# Patient Record
Sex: Female | Born: 1950 | Race: White | Hispanic: No | Marital: Married | Smoking: Never smoker
Health system: Southern US, Community
[De-identification: ages and names within clinical notes are randomized; demographics above are authoritative.]

## PROBLEM LIST (undated history)

## (undated) DIAGNOSIS — E7800 Pure hypercholesterolemia, unspecified: Secondary | ICD-10-CM

## (undated) DIAGNOSIS — E119 Type 2 diabetes mellitus without complications: Secondary | ICD-10-CM

## (undated) DIAGNOSIS — E669 Obesity, unspecified: Secondary | ICD-10-CM

## (undated) DIAGNOSIS — Z9889 Other specified postprocedural states: Secondary | ICD-10-CM

## (undated) DIAGNOSIS — Z860100 Personal history of colon polyps, unspecified: Secondary | ICD-10-CM

## (undated) DIAGNOSIS — R002 Palpitations: Secondary | ICD-10-CM

## (undated) DIAGNOSIS — Z9989 Dependence on other enabling machines and devices: Secondary | ICD-10-CM

## (undated) DIAGNOSIS — I371 Nonrheumatic pulmonary valve insufficiency: Secondary | ICD-10-CM

## (undated) DIAGNOSIS — Z803 Family history of malignant neoplasm of breast: Secondary | ICD-10-CM

## (undated) DIAGNOSIS — I4892 Unspecified atrial flutter: Secondary | ICD-10-CM

## (undated) DIAGNOSIS — G709 Myoneural disorder, unspecified: Secondary | ICD-10-CM

## (undated) DIAGNOSIS — I6529 Occlusion and stenosis of unspecified carotid artery: Secondary | ICD-10-CM

## (undated) DIAGNOSIS — G459 Transient cerebral ischemic attack, unspecified: Secondary | ICD-10-CM

## (undated) DIAGNOSIS — M1990 Unspecified osteoarthritis, unspecified site: Secondary | ICD-10-CM

## (undated) DIAGNOSIS — M810 Age-related osteoporosis without current pathological fracture: Secondary | ICD-10-CM

## (undated) DIAGNOSIS — I10 Essential (primary) hypertension: Secondary | ICD-10-CM

## (undated) DIAGNOSIS — H269 Unspecified cataract: Secondary | ICD-10-CM

## (undated) DIAGNOSIS — R519 Headache, unspecified: Secondary | ICD-10-CM

## (undated) DIAGNOSIS — Z1239 Encounter for other screening for malignant neoplasm of breast: Secondary | ICD-10-CM

## (undated) DIAGNOSIS — Z1211 Encounter for screening for malignant neoplasm of colon: Secondary | ICD-10-CM

## (undated) DIAGNOSIS — T4145XA Adverse effect of unspecified anesthetic, initial encounter: Secondary | ICD-10-CM

## (undated) DIAGNOSIS — N6019 Diffuse cystic mastopathy of unspecified breast: Secondary | ICD-10-CM

## (undated) DIAGNOSIS — I499 Cardiac arrhythmia, unspecified: Secondary | ICD-10-CM

## (undated) DIAGNOSIS — K219 Gastro-esophageal reflux disease without esophagitis: Secondary | ICD-10-CM

## (undated) DIAGNOSIS — R609 Edema, unspecified: Secondary | ICD-10-CM

## (undated) DIAGNOSIS — E538 Deficiency of other specified B group vitamins: Secondary | ICD-10-CM

## (undated) DIAGNOSIS — T8859XA Other complications of anesthesia, initial encounter: Secondary | ICD-10-CM

## (undated) DIAGNOSIS — Z8489 Family history of other specified conditions: Secondary | ICD-10-CM

## (undated) DIAGNOSIS — I639 Cerebral infarction, unspecified: Secondary | ICD-10-CM

## (undated) DIAGNOSIS — R112 Nausea with vomiting, unspecified: Secondary | ICD-10-CM

## (undated) DIAGNOSIS — G4730 Sleep apnea, unspecified: Secondary | ICD-10-CM

## (undated) HISTORY — DX: Diffuse cystic mastopathy of unspecified breast: N60.19

## (undated) HISTORY — DX: Unspecified osteoarthritis, unspecified site: M19.90

## (undated) HISTORY — DX: Essential (primary) hypertension: I10

## (undated) HISTORY — PX: COLONOSCOPY: SHX174

## (undated) HISTORY — DX: Type 2 diabetes mellitus without complications: E11.9

## (undated) HISTORY — DX: Unspecified atrial flutter: I48.92

## (undated) HISTORY — PX: EYE SURGERY: SHX253

## (undated) HISTORY — PX: DILATION AND CURETTAGE OF UTERUS: SHX78

## (undated) HISTORY — DX: Personal history of colonic polyps: Z86.010

## (undated) HISTORY — DX: Personal history of colon polyps, unspecified: Z86.0100

## (undated) HISTORY — DX: Pure hypercholesterolemia, unspecified: E78.00

## (undated) HISTORY — DX: Obesity, unspecified: E66.9

## (undated) HISTORY — DX: Family history of malignant neoplasm of breast: Z80.3

## (undated) HISTORY — DX: Encounter for other screening for malignant neoplasm of breast: Z12.39

## (undated) HISTORY — PX: OOPHORECTOMY: SHX86

## (undated) HISTORY — DX: Encounter for screening for malignant neoplasm of colon: Z12.11

---

## 1976-03-26 HISTORY — PX: LAPAROSCOPY: SHX197

## 1976-03-26 HISTORY — PX: PILONIDAL CYST / SINUS EXCISION: SUR543

## 1981-03-26 HISTORY — PX: TUBAL LIGATION: SHX77

## 1989-03-26 HISTORY — PX: BREAST EXCISIONAL BIOPSY: SUR124

## 1993-03-26 HISTORY — PX: BREAST EXCISIONAL BIOPSY: SUR124

## 1996-03-26 HISTORY — PX: ABDOMINAL HYSTERECTOMY: SHX81

## 2004-02-03 ENCOUNTER — Ambulatory Visit: Payer: Self-pay | Admitting: General Surgery

## 2004-02-14 ENCOUNTER — Ambulatory Visit: Payer: Self-pay | Admitting: General Surgery

## 2005-02-08 ENCOUNTER — Ambulatory Visit: Payer: Self-pay | Admitting: General Surgery

## 2005-02-16 ENCOUNTER — Ambulatory Visit: Payer: Self-pay | Admitting: Family Medicine

## 2006-02-26 ENCOUNTER — Ambulatory Visit: Payer: Self-pay | Admitting: General Surgery

## 2007-04-08 ENCOUNTER — Ambulatory Visit: Payer: Self-pay | Admitting: General Surgery

## 2007-09-11 ENCOUNTER — Ambulatory Visit: Payer: Self-pay | Admitting: Unknown Physician Specialty

## 2008-03-26 HISTORY — PX: COLONOSCOPY: SHX174

## 2008-04-08 ENCOUNTER — Ambulatory Visit: Payer: Self-pay | Admitting: Unknown Physician Specialty

## 2009-03-19 ENCOUNTER — Emergency Department: Payer: Self-pay | Admitting: Unknown Physician Specialty

## 2009-04-08 ENCOUNTER — Emergency Department: Payer: Self-pay | Admitting: Emergency Medicine

## 2009-04-21 ENCOUNTER — Ambulatory Visit: Payer: Self-pay | Admitting: Internal Medicine

## 2009-05-09 ENCOUNTER — Ambulatory Visit: Payer: Self-pay | Admitting: General Surgery

## 2009-05-17 ENCOUNTER — Ambulatory Visit: Payer: Self-pay | Admitting: Internal Medicine

## 2009-12-14 ENCOUNTER — Ambulatory Visit: Payer: Self-pay | Admitting: Family Medicine

## 2010-05-10 ENCOUNTER — Ambulatory Visit: Payer: Self-pay | Admitting: General Surgery

## 2011-03-14 ENCOUNTER — Ambulatory Visit: Payer: Self-pay | Admitting: Obstetrics and Gynecology

## 2011-05-14 ENCOUNTER — Ambulatory Visit: Payer: Self-pay | Admitting: General Surgery

## 2011-07-03 ENCOUNTER — Ambulatory Visit: Payer: Self-pay | Admitting: Otolaryngology

## 2011-07-03 LAB — CREATININE, SERUM
Creatinine: 0.83 mg/dL (ref 0.60–1.30)
EGFR (African American): >60
EGFR (Non-African Amer.): >60

## 2012-05-03 ENCOUNTER — Encounter: Payer: Self-pay | Admitting: General Surgery

## 2012-06-11 ENCOUNTER — Ambulatory Visit: Payer: Self-pay | Admitting: General Surgery

## 2012-06-18 ENCOUNTER — Ambulatory Visit: Payer: Self-pay | Admitting: General Surgery

## 2012-06-19 ENCOUNTER — Encounter: Payer: Self-pay | Admitting: General Surgery

## 2012-07-01 ENCOUNTER — Ambulatory Visit (INDEPENDENT_AMBULATORY_CARE_PROVIDER_SITE_OTHER): Payer: BC Managed Care – PPO | Admitting: General Surgery

## 2012-07-01 ENCOUNTER — Encounter: Payer: Self-pay | Admitting: General Surgery

## 2012-07-01 VITALS — BP 102/62 | HR 70 | Resp 12 | Ht 63.0 in | Wt 157.0 lb

## 2012-07-01 DIAGNOSIS — Z1231 Encounter for screening mammogram for malignant neoplasm of breast: Secondary | ICD-10-CM

## 2012-07-01 DIAGNOSIS — N6019 Diffuse cystic mastopathy of unspecified breast: Secondary | ICD-10-CM | POA: Insufficient documentation

## 2012-07-01 NOTE — Patient Instructions (Addendum)
Continue self breast exams. Call office for any new breast issues or concerns. Patient will be asked to return to the office in one year with a bilateral screening mammogram. 

## 2012-07-01 NOTE — Progress Notes (Signed)
Patient ID: Pamela Nash, female   DOB: Nov 29, 1950, 62 y.o.   MRN: [id number]  Chief Complaint  Patient presents with  . Other    mammogram    HPI Pamela Nash is a 62 y.o. female here today for her follow up mammogram done on 05/26/12.  Patient with known history of fibrocystic breast disease.  Family history includes a maternal grandmother with breast cancer. No new breast issues. Pamela Nash HPI  Past Medical History  Diagnosis Date  . Unspecified essential hypertension   . Diffuse cystic mastopathy   . Arthritis   . Breast screening, unspecified   . Special screening for malignant neoplasms, colon   . Diabetes     non-insulin dependent  . Obesity, unspecified   . High cholesterol   . Personal history of colonic polyps   . Atrial flutter     c-pap dependent  . Family history of malignant neoplasm of breast     Past Surgical History  Procedure Laterality Date  . Pilonidal cyst / sinus excision  1978  . Abdominal hysterectomy  1998  . Breast biopsy  1993  . Tubal ligation  1983  . Laparoscopy  1978  . Colonoscopy  2010    Family History  Problem Relation Age of Onset  . Breast cancer Other     Social History History  Substance Use Topics  . Smoking status: Never Smoker   . Smokeless tobacco: Not on file  . Alcohol Use: No    Allergies  Allergen Reactions  . Sulfa Antibiotics Hives    Current Outpatient Prescriptions  Medication Sig Dispense Refill  . aspirin 81 MG tablet Take 81 mg by mouth daily.      . Cholecalciferol (VITAMIN D-3) 1000 UNITS CAPS Take by mouth 2 (two) times daily.      Pamela Nash losartan-hydrochlorothiazide (HYZAAR) 50-12.5 MG per tablet       . metFORMIN (GLUCOPHAGE) 500 MG tablet Take 500 mg by mouth 2 (two) times daily with a meal.      . metoprolol tartrate (LOPRESSOR) 25 MG tablet Take 25 mg by mouth daily.       Letta Pate DELICA LANCETS 33G MISC       . potassium chloride (K-DUR,KLOR-CON) 10 MEQ tablet Take 10 mEq by mouth 2 (two) times  daily.      . rosuvastatin (CRESTOR) 10 MG tablet Take 10 mg by mouth daily.      Pamela Nash VIVELLE-DOT 0.1 MG/24HR Place 1 patch onto the skin 2 (two) times a week.       . ONE TOUCH ULTRA TEST test strip        No current facility-administered medications for this visit.    Review of Systems Review of Systems  Constitutional: Negative.   Respiratory: Negative.   Cardiovascular: Negative.     Blood pressure 102/62, pulse 70, resp. rate 12, height 5\' 3"  (1.6 m), weight 157 lb (71.215 kg).  Physical Exam Physical Exam  Constitutional: She appears well-developed and well-nourished.  Neck: No mass and no thyromegaly present.  Cardiovascular: Normal rate and regular rhythm.   Pulmonary/Chest: Effort normal and breath sounds normal. Right breast exhibits no inverted nipple, no mass, no nipple discharge, no skin change and no tenderness. Left breast exhibits no inverted nipple, no mass, no nipple discharge, no skin change and no tenderness.  Abdominal: Soft.  Lymphadenopathy:    She has no cervical adenopathy.    She has no axillary adenopathy.    Data Reviewed Mammogram reviewed-BIRADS  2  Assessment    Stable exam.     Plan    1 yr f/u        SANKAR,SEEPLAPUTHUR G 07/01/2012, 8:16 PM

## 2013-01-23 ENCOUNTER — Ambulatory Visit: Payer: Self-pay | Admitting: Unknown Physician Specialty

## 2013-06-23 ENCOUNTER — Ambulatory Visit: Payer: Self-pay | Admitting: General Surgery

## 2013-06-26 ENCOUNTER — Ambulatory Visit: Payer: Self-pay | Admitting: General Surgery

## 2013-06-29 ENCOUNTER — Encounter: Payer: Self-pay | Admitting: General Surgery

## 2013-06-30 ENCOUNTER — Ambulatory Visit (INDEPENDENT_AMBULATORY_CARE_PROVIDER_SITE_OTHER): Payer: BC Managed Care – PPO | Admitting: General Surgery

## 2013-06-30 ENCOUNTER — Encounter: Payer: Self-pay | Admitting: General Surgery

## 2013-06-30 VITALS — BP 148/76 | HR 80 | Resp 12 | Ht 63.75 in | Wt 159.0 lb

## 2013-06-30 DIAGNOSIS — N6019 Diffuse cystic mastopathy of unspecified breast: Secondary | ICD-10-CM

## 2013-06-30 DIAGNOSIS — Z803 Family history of malignant neoplasm of breast: Secondary | ICD-10-CM

## 2013-06-30 NOTE — Patient Instructions (Signed)
Continue self breast exams. Call office for any new breast issues or concerns. 

## 2013-06-30 NOTE — Progress Notes (Signed)
Patient ID: Pamela Nash, female   DOB: 22-Jun-1950, 63 y.o.   MRN: [id number]  Chief Complaint  Patient presents with  . Follow-up    mammogram    HPI Pamela Nash is a 63 y.o. female.  who presents for her annual breast evaluation. The most recent mammogram was done on 06-23-13. An ultrasound was done as well. Patient does perform regular self breast checks and gets regular mammograms done.  No new breast complaints.   HPI  Past Medical History  Diagnosis Date  . Unspecified essential hypertension   . Diffuse cystic mastopathy   . Arthritis   . Breast screening, unspecified   . Special screening for malignant neoplasms, colon   . Diabetes     non-insulin dependent  . Obesity, unspecified   . High cholesterol   . Personal history of colonic polyps   . Atrial flutter     c-pap dependent  . Family history of malignant neoplasm of breast     Past Surgical History  Procedure Laterality Date  . Pilonidal cyst / sinus excision  1978  . Abdominal hysterectomy  1998  . Breast biopsy  1993  . Tubal ligation  1983  . Laparoscopy  1978  . Colonoscopy  2010    Family History  Problem Relation Age of Onset  . Breast cancer Maternal Grandmother     Social History History  Substance Use Topics  . Smoking status: Never Smoker   . Smokeless tobacco: Not on file  . Alcohol Use: No    Allergies  Allergen Reactions  . Sulfa Antibiotics Hives    Current Outpatient Prescriptions  Medication Sig Dispense Refill  . aspirin 81 MG tablet Take 81 mg by mouth daily.      . Cholecalciferol (VITAMIN D-3) 1000 UNITS CAPS Take by mouth 2 (two) times daily.      Marland Kitchen losartan-hydrochlorothiazide (HYZAAR) 50-12.5 MG per tablet       . metFORMIN (GLUCOPHAGE) 500 MG tablet Take 500 mg by mouth 2 (two) times daily with a meal.      . metoprolol tartrate (LOPRESSOR) 25 MG tablet Take 25 mg by mouth daily.       . ONE TOUCH ULTRA TEST test strip       . ONETOUCH DELICA LANCETS 09F MISC        . rosuvastatin (CRESTOR) 10 MG tablet Take 10 mg by mouth daily.      Marland Kitchen VIVELLE-DOT 0.1 MG/24HR Place 1 patch onto the skin 2 (two) times a week.        No current facility-administered medications for this visit.    Review of Systems Review of Systems  Constitutional: Negative.   Respiratory: Negative.   Cardiovascular: Negative.     Blood pressure 148/76, pulse 80, resp. rate 12, height 5' 3.75" (1.619 m), weight 159 lb (72.122 kg).  Physical Exam Physical Exam  Constitutional: She is oriented to person, place, and time. She appears well-developed and well-nourished.  Eyes: Conjunctivae are normal.  Neck: Neck supple.  Cardiovascular: Normal rate, regular rhythm and normal heart sounds.   Pulmonary/Chest: Effort normal and breath sounds normal. Right breast exhibits no inverted nipple, no mass, no nipple discharge, no skin change and no tenderness. Left breast exhibits no mass, no nipple discharge, no skin change and no tenderness.  Lymphadenopathy:    She has no cervical adenopathy.    She has no axillary adenopathy.  Neurological: She is alert and oriented to person, place, and time.  Skin:  Skin is warm and dry.    Data Reviewed Mammogram and ultrasound reviewed. Screening study showed exteremly subtle density upper inner right breast. Additional views and ultrasound were both normal.   Assessment    Stable physical exam.     Plan    Discuss with radiologist and determine follow up 6 months or one year.       Atlee Kluth G Jawon Dipiero 07/01/2013, 7:23 AM

## 2013-07-01 ENCOUNTER — Encounter: Payer: Self-pay | Admitting: General Surgery

## 2013-07-01 DIAGNOSIS — Z803 Family history of malignant neoplasm of breast: Secondary | ICD-10-CM | POA: Insufficient documentation

## 2013-07-28 ENCOUNTER — Ambulatory Visit: Payer: Self-pay | Admitting: Family Medicine

## 2013-08-24 ENCOUNTER — Ambulatory Visit: Payer: Self-pay | Admitting: Family Medicine

## 2013-09-18 DIAGNOSIS — E785 Hyperlipidemia, unspecified: Secondary | ICD-10-CM | POA: Insufficient documentation

## 2013-09-18 DIAGNOSIS — I491 Atrial premature depolarization: Secondary | ICD-10-CM | POA: Insufficient documentation

## 2013-09-18 DIAGNOSIS — E119 Type 2 diabetes mellitus without complications: Secondary | ICD-10-CM | POA: Insufficient documentation

## 2013-09-18 DIAGNOSIS — I10 Essential (primary) hypertension: Secondary | ICD-10-CM | POA: Insufficient documentation

## 2013-09-18 DIAGNOSIS — G4730 Sleep apnea, unspecified: Secondary | ICD-10-CM | POA: Insufficient documentation

## 2013-09-23 ENCOUNTER — Ambulatory Visit: Payer: Self-pay | Admitting: Family Medicine

## 2013-10-22 ENCOUNTER — Ambulatory Visit (INDEPENDENT_AMBULATORY_CARE_PROVIDER_SITE_OTHER): Payer: BC Managed Care – PPO | Admitting: Podiatry

## 2013-10-22 ENCOUNTER — Encounter: Payer: Self-pay | Admitting: Podiatry

## 2013-10-22 ENCOUNTER — Ambulatory Visit (INDEPENDENT_AMBULATORY_CARE_PROVIDER_SITE_OTHER): Payer: BC Managed Care – PPO

## 2013-10-22 VITALS — BP 134/75 | HR 76 | Resp 16 | Ht 63.75 in | Wt 156.0 lb

## 2013-10-22 DIAGNOSIS — E119 Type 2 diabetes mellitus without complications: Secondary | ICD-10-CM

## 2013-10-22 DIAGNOSIS — M7660 Achilles tendinitis, unspecified leg: Secondary | ICD-10-CM

## 2013-10-22 DIAGNOSIS — M779 Enthesopathy, unspecified: Secondary | ICD-10-CM

## 2013-10-22 MED ORDER — MELOXICAM 15 MG PO TABS: 15.0000 mg | ORAL_TABLET | Freq: Every day | ORAL | Status: DC

## 2013-10-22 NOTE — Progress Notes (Signed)
   Subjective:    Patient ID: Pamela Nash, female    DOB: 1950/06/04, 63 y.o.   MRN: [id number]  HPI Comments: Pain in the back of the right heel, its always when i get up , the pain starts, going up stairs is ok , coming down hurts. It throbs it aches, this has been going on 4-5 months . Diabetes last a1c 7.3   Foot Pain      Review of Systems  Endocrine:       Diabetes   Hematological:       Slow to heal   All other systems reviewed and are negative.      Objective:   Physical Exam: I have reviewed her past medical history medications allergies surgeries social history and review of systems. Pulses are strongly palpable bilateral. Neurologic sensorium is intact per Semmes-Weinstein monofilament. Deep tendon reflexes are intact bilateral muscle strength is 5 over 5 dorsiflexors plantar flexors inverters everters all intrinsic musculature is intact. Orthopedic evaluation demonstrates pain on palpation posterior aspect of the calcaneus. She also has tenderness on palpation of the tendo Achilles at its insertion site. No pain on palpation plantar medial aspect of the right heel. Radiographic evaluation does demonstrate a retrocalcaneal heel spur with soft tissue increase in density at the tendo Achilles insertion site of the right heel.        Assessment & Plan:  Assessment: Non-insulin-dependent diabetes mellitus. Insertional tendo Achilles tendinitis right heel.  Plan: Started her on meloxicam today. Injected subcutaneously with 2 mg of dexamethasone to the posterior aspect of the right heel. We also placed her in a Cam Walker. She was dispensed a night splint as well. I will followup with her in approximately one month. If this does not resolve physical therapy will be our next option.

## 2013-10-28 ENCOUNTER — Telehealth: Payer: Self-pay | Admitting: *Deleted

## 2013-10-28 NOTE — Telephone Encounter (Signed)
Patient called stating that the brace that we gave her to wear during the day seems to aggravate her foot more than helping it. She states that it is making the foot hurt and ankle to swell.  i offered the patient a larger size brace or to stop wearing it until she follows up with dr Milinda Pointer, she stated she thinks she would like to stop wearing the brace and wear her night splint and follow up with dr Milinda Pointer on her next visit

## 2013-11-19 ENCOUNTER — Encounter: Payer: Self-pay | Admitting: Podiatry

## 2013-11-19 ENCOUNTER — Ambulatory Visit (INDEPENDENT_AMBULATORY_CARE_PROVIDER_SITE_OTHER): Payer: BC Managed Care – PPO | Admitting: Podiatry

## 2013-11-19 VITALS — BP 130/76 | HR 88 | Resp 16

## 2013-11-19 DIAGNOSIS — M722 Plantar fascial fibromatosis: Secondary | ICD-10-CM

## 2013-11-19 DIAGNOSIS — E119 Type 2 diabetes mellitus without complications: Secondary | ICD-10-CM

## 2013-11-19 DIAGNOSIS — M7660 Achilles tendinitis, unspecified leg: Secondary | ICD-10-CM

## 2013-11-19 NOTE — Progress Notes (Signed)
She presents today saying that her Achilles tendinitis is approximately 80% resolved. She still has some tenderness on the plantar aspect of the right heel he continues all conservative therapies.  Objective: She is mild tenderness on palpation of the tendo Achilles insertion site right heel. She has moderate pain on palpation medial continued tubercle right heel.  Assessment: Plantar fasciitis resulting in Achilles tendinitis right.  Plan: Injected the right plantar heel today she will continue all conservative therapies. Followup with her in one month at which time physical therapy may be necessary.

## 2013-12-14 ENCOUNTER — Ambulatory Visit: Payer: BC Managed Care – PPO | Admitting: Podiatry

## 2013-12-23 DIAGNOSIS — I10 Essential (primary) hypertension: Secondary | ICD-10-CM | POA: Insufficient documentation

## 2014-01-07 ENCOUNTER — Ambulatory Visit: Payer: Self-pay | Admitting: Family Medicine

## 2014-01-25 ENCOUNTER — Encounter: Payer: Self-pay | Admitting: Podiatry

## 2014-03-26 HISTORY — PX: BREAST BIOPSY: SHX20

## 2014-05-26 ENCOUNTER — Ambulatory Visit (INDEPENDENT_AMBULATORY_CARE_PROVIDER_SITE_OTHER): Payer: BC Managed Care – PPO | Admitting: Podiatry

## 2014-05-26 ENCOUNTER — Encounter: Payer: Self-pay | Admitting: Podiatry

## 2014-05-26 VITALS — BP 124/59 | HR 89 | Resp 16

## 2014-05-26 DIAGNOSIS — M7661 Achilles tendinitis, right leg: Secondary | ICD-10-CM

## 2014-05-26 DIAGNOSIS — E119 Type 2 diabetes mellitus without complications: Secondary | ICD-10-CM

## 2014-05-26 MED ORDER — MELOXICAM 15 MG PO TABS: 15.0000 mg | ORAL_TABLET | Freq: Every day | ORAL | Status: DC

## 2014-05-26 NOTE — Progress Notes (Signed)
[employer] MRI RIGHT ANKLE APPROVAL #[id number]   She presents today with chief complaint of any pain to her ankle. She states that he was doing better and then it started hurting again. She says her really don't know what happened to it but is limiting my daily activities.  Objective: Vital signs are stable she is alert and oriented 3. Pulses are palpable there is no calf pain she has pain and fluctuance upon palpation of the Achilles tendon near its insertion site on the posterior calcaneus. It is warm to the touch and painful. Radiographic evaluation demonstrates retrocalcaneal heel spur soft tissue crease density at the Achilles insertion site indicative of it the very least an inflammatory event area however I do suspect a partial tear with this type of recurrence.  Assessment: Probable interstitial tear of the tendo Achilles right heel.  Plan: Discussed etiology pathology conservative versus surgical therapies. I placed her in a Cam Walker and we will request an MRI to evaluate her integrity of the Achilles. I also provided her with anti-inflammatories.

## 2014-06-03 ENCOUNTER — Ambulatory Visit: Payer: Self-pay | Admitting: Podiatry

## 2014-06-08 ENCOUNTER — Telehealth: Payer: Self-pay | Admitting: *Deleted

## 2014-06-08 NOTE — Telephone Encounter (Signed)
Left message for patient informing her of over read services

## 2014-06-28 ENCOUNTER — Encounter: Payer: Self-pay | Admitting: Podiatry

## 2014-06-30 ENCOUNTER — Encounter: Payer: Self-pay | Admitting: Podiatry

## 2014-06-30 ENCOUNTER — Ambulatory Visit (INDEPENDENT_AMBULATORY_CARE_PROVIDER_SITE_OTHER): Payer: BC Managed Care – PPO | Admitting: Podiatry

## 2014-06-30 VITALS — BP 140/80 | HR 78 | Resp 16 | Ht 63.0 in | Wt 160.0 lb

## 2014-06-30 DIAGNOSIS — M7661 Achilles tendinitis, right leg: Secondary | ICD-10-CM | POA: Diagnosis not present

## 2014-06-30 NOTE — Progress Notes (Signed)
She presents today for follow-up of her MRI for her right heel. She states that as long as I wear the boot I feel fine however once I come out of the boot the foot hurts.  Objective: Vital signs are stable she is alert and oriented 3. Pulses are palpable. MRI states chronic insertional Achilles tendinitis posterior heel area  Assessment: Chronic Achilles tendinitis.  Plan: At this point I feel safe sending her to physical therapy for this and I will follow-up with her in 1 month I did go on to explain to her should this not alleviate her symptoms then surgical procedure will be necessary.

## 2014-07-07 ENCOUNTER — Ambulatory Visit
Admit: 2014-07-07 | Disposition: A | Discharge status: Home / Self Care | Payer: Self-pay | Attending: Family Medicine | Admitting: Family Medicine

## 2014-07-12 ENCOUNTER — Ambulatory Visit
Admit: 2014-07-12 | Disposition: A | Discharge status: Home / Self Care | Payer: Self-pay | Attending: Family Medicine | Admitting: Family Medicine

## 2014-07-19 LAB — SURGICAL PATHOLOGY

## 2014-08-02 ENCOUNTER — Ambulatory Visit (INDEPENDENT_AMBULATORY_CARE_PROVIDER_SITE_OTHER): Payer: BC Managed Care – PPO | Admitting: Podiatry

## 2014-08-02 ENCOUNTER — Encounter: Payer: Self-pay | Admitting: Podiatry

## 2014-08-02 VITALS — Ht 63.0 in | Wt 158.0 lb

## 2014-08-02 DIAGNOSIS — E119 Type 2 diabetes mellitus without complications: Secondary | ICD-10-CM

## 2014-08-02 DIAGNOSIS — M7661 Achilles tendinitis, right leg: Secondary | ICD-10-CM | POA: Diagnosis not present

## 2014-08-03 NOTE — Progress Notes (Signed)
She presents today following physical therapy for Achilles tendinitis. Physical therapist as he suggested orthotics.  Objective: Much decreased pain on palpation posterior aspect of the right foot. Achilles appears to be intact with no erythema edema saline as drainage or odor.  Assessment: Achilles tendinitis associated plantar fasciitis right.  Plan: She was scanned for several orthotics.

## 2014-08-30 ENCOUNTER — Ambulatory Visit: Payer: BC Managed Care – PPO | Admitting: Podiatry

## 2014-09-08 ENCOUNTER — Ambulatory Visit (INDEPENDENT_AMBULATORY_CARE_PROVIDER_SITE_OTHER): Payer: BC Managed Care – PPO | Admitting: Podiatry

## 2014-09-08 ENCOUNTER — Encounter: Payer: Self-pay | Admitting: Podiatry

## 2014-09-08 ENCOUNTER — Ambulatory Visit: Payer: BC Managed Care – PPO | Admitting: Podiatry

## 2014-09-08 VITALS — BP 132/68 | HR 90 | Resp 16

## 2014-09-08 DIAGNOSIS — M7661 Achilles tendinitis, right leg: Secondary | ICD-10-CM | POA: Diagnosis not present

## 2014-09-08 MED ORDER — DICLOFENAC SODIUM 1 % TD GEL: 4.0000 g | Freq: Four times a day (QID) | TRANSDERMAL | Status: DC

## 2014-09-08 NOTE — Patient Instructions (Signed)

## 2014-09-09 NOTE — Progress Notes (Signed)
She presents today for follow-up of Achilles tendinitis. She is going to go for orthotics today.  Objective: Vital signs are stable she is alert and oriented 3 she has mild tennis on palpation of the left Achilles tendon.  Assessment: Achilles tendinitis.  Plan: Encouraged ice therapy utilization of her orthotics and I wrote a prescription for diclofenac gel.

## 2014-09-14 ENCOUNTER — Telehealth: Payer: Self-pay | Admitting: *Deleted

## 2014-09-14 NOTE — Telephone Encounter (Signed)
Dan states pt's Voltaren Gel needs prior authortization.

## 2014-09-15 NOTE — Telephone Encounter (Signed)
Jody working on authorization

## 2014-09-16 NOTE — Telephone Encounter (Signed)
[facility] called again checking on status of volteran gel prior auth.

## 2014-09-20 NOTE — Telephone Encounter (Signed)
voltaren gel approved good through 08/21/14-09/20/15 , [facility] in [place] was notified

## 2014-10-11 ENCOUNTER — Ambulatory Visit: Payer: BC Managed Care – PPO | Admitting: Podiatry

## 2014-10-18 ENCOUNTER — Encounter: Payer: Self-pay | Admitting: Podiatry

## 2014-10-18 ENCOUNTER — Ambulatory Visit (INDEPENDENT_AMBULATORY_CARE_PROVIDER_SITE_OTHER): Payer: BC Managed Care – PPO | Admitting: Podiatry

## 2014-10-18 VITALS — BP 116/66 | HR 74 | Resp 16

## 2014-10-18 DIAGNOSIS — M7661 Achilles tendinitis, right leg: Secondary | ICD-10-CM

## 2014-10-18 NOTE — Progress Notes (Signed)
She presents today for follow-up of her Achilles tendinitis of her right heel. She states that she is nearly 100% better though she still has some tenderness in the very most posterior aspect of the right heel. She states that the orthotics seem to be doing a great job.  Objective: Vital signs are stable she is alert and oriented 3. She has mild tenderness on palpation of a small area of fluctuance posterior aspect of the right heel as the Achilles attaches to the calcaneus.  Assessment: Insertional Achilles tendinitis with bursitis right heel.  Plan: Discussed etiology pathology conservative versus surgical therapies I injected 2 mg of dexamethasone subcutaneously today to the bursa. I will follow-up with her on an as-needed basis she will continue to utilize her orthotics.

## 2014-11-22 ENCOUNTER — Ambulatory Visit: Payer: BC Managed Care – PPO | Admitting: Podiatry

## 2015-08-17 ENCOUNTER — Other Ambulatory Visit: Payer: Self-pay | Admitting: Family Medicine

## 2015-08-17 DIAGNOSIS — Z1231 Encounter for screening mammogram for malignant neoplasm of breast: Secondary | ICD-10-CM

## 2015-09-01 ENCOUNTER — Ambulatory Visit: Payer: Self-pay

## 2015-09-07 ENCOUNTER — Other Ambulatory Visit: Payer: Self-pay | Admitting: Family Medicine

## 2015-09-07 ENCOUNTER — Ambulatory Visit
Admission: RE | Admit: 2015-09-07 | Discharge: 2015-09-07 | Disposition: A | Discharge status: Home / Self Care | Payer: BC Managed Care – PPO | Source: Ambulatory Visit | Attending: Family Medicine | Admitting: Family Medicine

## 2015-09-07 DIAGNOSIS — Z1231 Encounter for screening mammogram for malignant neoplasm of breast: Secondary | ICD-10-CM | POA: Diagnosis not present

## 2015-09-19 ENCOUNTER — Other Ambulatory Visit: Payer: Self-pay

## 2015-09-19 ENCOUNTER — Encounter: Payer: Self-pay | Admitting: Emergency Medicine

## 2015-09-19 ENCOUNTER — Emergency Department
Admission: EM | Admit: 2015-09-19 | Discharge: 2015-09-19 | Disposition: A | Discharge status: Home / Self Care | Payer: BC Managed Care – PPO | Attending: Emergency Medicine | Admitting: Emergency Medicine

## 2015-09-19 DIAGNOSIS — R42 Dizziness and giddiness: Secondary | ICD-10-CM

## 2015-09-19 DIAGNOSIS — Z7982 Long term (current) use of aspirin: Secondary | ICD-10-CM | POA: Insufficient documentation

## 2015-09-19 DIAGNOSIS — I10 Essential (primary) hypertension: Secondary | ICD-10-CM | POA: Diagnosis not present

## 2015-09-19 DIAGNOSIS — I4892 Unspecified atrial flutter: Secondary | ICD-10-CM | POA: Insufficient documentation

## 2015-09-19 DIAGNOSIS — Z86010 Personal history of colonic polyps: Secondary | ICD-10-CM | POA: Insufficient documentation

## 2015-09-19 DIAGNOSIS — Z79899 Other long term (current) drug therapy: Secondary | ICD-10-CM | POA: Diagnosis not present

## 2015-09-19 DIAGNOSIS — E119 Type 2 diabetes mellitus without complications: Secondary | ICD-10-CM | POA: Insufficient documentation

## 2015-09-19 DIAGNOSIS — Z7984 Long term (current) use of oral hypoglycemic drugs: Secondary | ICD-10-CM | POA: Diagnosis not present

## 2015-09-19 DIAGNOSIS — M1990 Unspecified osteoarthritis, unspecified site: Secondary | ICD-10-CM | POA: Insufficient documentation

## 2015-09-19 DIAGNOSIS — E785 Hyperlipidemia, unspecified: Secondary | ICD-10-CM | POA: Diagnosis not present

## 2015-09-19 DIAGNOSIS — I491 Atrial premature depolarization: Secondary | ICD-10-CM | POA: Insufficient documentation

## 2015-09-19 LAB — COMPREHENSIVE METABOLIC PANEL
ALBUMIN: 4 g/dL (ref 3.5–5.0)
ALK PHOS: 68 U/L (ref 38–126)
ALT: 19 U/L (ref 14–54)
ANION GAP: 9 (ref 5–15)
AST: 25 U/L (ref 15–41)
BILIRUBIN TOTAL: 0.6 mg/dL (ref 0.3–1.2)
BUN: 15 mg/dL (ref 6–20)
CALCIUM: 9.3 mg/dL (ref 8.9–10.3)
CO2: 28 mmol/L (ref 22–32)
Chloride: 100 mmol/L — ABNORMAL LOW (ref 101–111)
Creatinine, Ser: 0.79 mg/dL (ref 0.44–1.00)
GFR calc Af Amer: >60 mL/min (ref >60)
GLUCOSE: 192 mg/dL — AB (ref 65–99)
Potassium: 3.2 mmol/L — ABNORMAL LOW (ref 3.5–5.1)
Sodium: 137 mmol/L (ref 135–145)
TOTAL PROTEIN: 7.5 g/dL (ref 6.5–8.1)

## 2015-09-19 LAB — CBC
HCT: 37.6 % (ref 35.0–47.0)
Hemoglobin: 13 g/dL (ref 12.0–16.0)
MCH: 29 pg (ref 26.0–34.0)
MCHC: 34.6 g/dL (ref 32.0–36.0)
MCV: 84 fL (ref 80.0–100.0)
PLATELETS: 290 10*3/uL (ref 150–440)
RBC: 4.48 MIL/uL (ref 3.80–5.20)
RDW: 12.1 % (ref 11.5–14.5)
WBC: 7 10*3/uL (ref 3.6–11.0)

## 2015-09-19 LAB — TROPONIN I: Troponin I: <0.03 ng/mL (ref <0.031)

## 2015-09-19 MED ORDER — SODIUM CHLORIDE 0.9 % IV SOLN
1000.0000 mL | Freq: Once | INTRAVENOUS | Status: AC
  Administered 2015-09-19: 1000 mL via INTRAVENOUS

## 2015-09-19 NOTE — ED Notes (Signed)
Pt presents to ED with reports of dizziness and palpitations. Pt states has a history of premature atrial contractions. Pt reports nausea but no vomiting. Pt denies pain.

## 2015-09-19 NOTE — Discharge Instructions (Signed)
Premature Atrial Contraction Premature atrial contractions (PACs) happen when your heart beats before it has had time to fill with blood. Your heart then has to pause until it can fill with blood for the next beat. This causes the next beat to be more forceful. PACs are also called skipped heartbeats because it may feel like your heart stops for a second.  Your heart has four chambers. There are two upper chambers (atria) and two lower chambers (ventricles). All the chambers need to work together to pump blood properly. Electrical signals spread across your heart and make all the chambers beat together.The signal to beat starts in your atria. If the atria fire a bit early, you may have a PAC.  CAUSES  The cause of a PAC is often unknown. PACs are sometimes caused by heart disease or injury. RISK FACTORS PACs are more common in children and older people. Other risk factors that may trigger PACs include:  Caffeine.  Stress.  Fatigue.  Alcohol.  Smoking.  Stimulant drugs. These may be prescription or illegal drugs.  Heart disease. SIGNS AND SYMPTOMS PACs are very common, especially in children and people 50 years and older. PACs do not cause dizziness, shortness of breath, or chest pain. The only symptom of a PAC is the sensation of a skipped or fluttering heartbeat.  DIAGNOSIS  Your health care provider can diagnose PAC based on the description of your symptom. Your health care provider may also:  Perform a physical exam to listen to your heart. Your heart may sound normal during this exam.  Perform tests to rule out other conditions. These tests may include an electrical tracing of your heart called electrocardiogram (ECG). You may need to wear a portable ECG machine (Holter monitor) that records your heart for 24 hours or more. TREATMENT  In most cases, PACs do not need to be treated. If you have frequent PACs that are caused by heart disease, you may be treated for the underlying  condition. HOME CARE INSTRUCTIONS  Do not use any tobacco products, including cigarettes, chewing tobacco, or electronic cigarettes. If you need help quitting, ask your health care provider.  Limit alcohol intake to no more than 1 drink per day for nonpregnant women and 2 drinks per day for men. One drink equals 12 ounces of beer, 5 ounces of wine, or 1 ounces of hard liquor.  Limit the amount of caffeine you take in.  Do not use illegal drugs.  Get at least 8 hours of sleep every night.  Find healthy ways to manage stress.  Get regular exercise. Ask your health care provider to suggest some activities that are safe for you. SEEK MEDICAL CARE IF:  You feel your heart skipping beats often (more than once a day).  Your heart skips beats and you feel dizzy, lightheaded, or very tired. SEEK IMMEDIATE MEDICAL CARE IF:   You have chest pain.  You have trouble breathing.   This information is not intended to replace advice given to you by your health care provider. Make sure you discuss any questions you have with your health care provider.   Document Released: 11/13/2013 Document Revised: 07/27/2014 Document Reviewed: 11/13/2013 [employer] Interactive Patient Education 2016 [employer]  

## 2015-09-19 NOTE — ED Provider Notes (Signed)
[facility] Emergency Department Provider Note  ____________________________________________    I have reviewed the triage vital signs and the nursing notes.   HISTORY  Chief Complaint Dizziness and Palpitations    HPI Pamela Nash is a 65 y.o. female who presents with complaints of palpitations and an episode of dizziness this morning. Patient reports that she has a history of premature atrial contractions and she feels these are recurring today and are more common than typical. She felt lightheaded when she stood up abruptly this morning but reports she feels better now. No chest pain. No shortness of breath. No recent travel. No calf pain or swelling. She takes metoprolol 12.5 mg twice a day for PACs     Past Medical History  Diagnosis Date  . Unspecified essential hypertension   . Diffuse cystic mastopathy   . Arthritis   . Breast screening, unspecified   . Special screening for malignant neoplasms, colon   . Diabetes ([place])     non-insulin dependent  . Obesity, unspecified   . High cholesterol   . Personal history of colonic polyps   . Atrial flutter ([place])     c-pap dependent  . Family history of malignant neoplasm of breast     Patient Active Problem List   Diagnosis Date Noted  . Diabetes ([place]) 09/18/2013  . HLD (hyperlipidemia) 09/18/2013  . BP (high blood pressure) 09/18/2013  . APC (atrial premature contractions) 09/18/2013  . Apnea, sleep 09/18/2013  . Family history of breast cancer 07/01/2013  . Fibrocystic breast disease 07/01/2012    Past Surgical History  Procedure Laterality Date  . Pilonidal cyst / sinus excision  1978  . Abdominal hysterectomy  1998  . Tubal ligation  1983  . Laparoscopy  1978  . Colonoscopy  2010  . Breast excisional biopsy Right 1991    NEG  . Breast excisional biopsy Right 1995    NEG  . Breast biopsy Right 2016    W/CLIP - NEG    Current Outpatient Rx  Name  Route  Sig  Dispense   Refill  . aspirin 81 MG tablet   Oral   Take 81 mg by mouth daily.         . Cholecalciferol (VITAMIN D-3) 1000 UNITS CAPS   Oral   Take by mouth 2 (two) times daily.         . diclofenac sodium (VOLTAREN) 1 % GEL   Topical   Apply 4 g topically 4 (four) times daily.   100 g   4   . losartan-hydrochlorothiazide (HYZAAR) 50-12.5 MG per tablet               . metFORMIN (GLUCOPHAGE) 500 MG tablet   Oral   Take 500 mg by mouth 2 (two) times daily with a meal.         . metoprolol tartrate (LOPRESSOR) 25 MG tablet   Oral   Take 25 mg by mouth daily.          . ONE TOUCH ULTRA TEST test strip               . ONETOUCH DELICA LANCETS [id number] MISC               . rosuvastatin (CRESTOR) 10 MG tablet   Oral   Take 10 mg by mouth daily.         Marland Kitchen VIVELLE-DOT 0.1 MG/24HR   Transdermal   Place 1 patch onto the  skin 2 (two) times a week.            Allergies Cefuroxime axetil; Lisinopril-hydrochlorothiazide; Pioglitazone; and Sulfa antibiotics  Family History  Problem Relation Age of Onset  . Breast cancer Maternal Grandmother     Social History Social History  Substance Use Topics  . Smoking status: Never Smoker   . Smokeless tobacco: None  . Alcohol Use: No    Review of Systems  Constitutional: Negative for fever. Eyes: Negative for redness ENT: Negative for sore throat Cardiovascular: Negative for chest pain Respiratory: Negative for shortness of breath. Gastrointestinal: Negative for abdominal pain Genitourinary: Negative for dysuria. Musculoskeletal: Negative for back pain. Skin: Negative for rash. Neurological: Negative for focal weakness Psychiatric: no anxiety    ____________________________________________   PHYSICAL EXAM:  VITAL SIGNS: ED Triage Vitals  Enc Vitals Group     BP 09/19/15 0744 151/73 mmHg     Pulse Rate 09/19/15 0744 82     Resp 09/19/15 0744 18     Temp 09/19/15 0744 97.8 F (36.6 C)     Temp src --       SpO2 09/19/15 0744 96 %     Weight 09/19/15 0744 158 lb (71.668 kg)     Height 09/19/15 0744 5' 3.5" (1.613 m)     Head Cir --      Peak Flow --      Pain Score 09/19/15 0750 0     Pain Loc --      Pain Edu? --      Excl. in [place]? --      Constitutional: Alert and oriented. Well appearing and in no distress.  Eyes: Conjunctivae are normal. No erythema or injection ENT   Head: Normocephalic and atraumatic.   Mouth/Throat: Mucous membranes are moist. Cardiovascular: Normal rate, regular rhythm. Normal and symmetric distal pulses are present in the upper extremities. No murmurs or rubs  Respiratory: Normal respiratory effort without tachypnea nor retractions. Breath sounds are clear and equal bilaterally.  Gastrointestinal: Soft and non-tender in all quadrants. No distention. There is no CVA tenderness. Genitourinary: deferred Musculoskeletal: Nontender with normal range of motion in all extremities. No lower extremity tenderness nor edema. Neurologic:  Normal speech and language. No gross focal neurologic deficits are appreciated. Skin:  Skin is warm, dry and intact. No rash noted. Psychiatric: Mood and affect are normal. Patient exhibits appropriate insight and judgment.  ____________________________________________    LABS (pertinent positives/negatives)  Labs Reviewed  COMPREHENSIVE METABOLIC PANEL - Abnormal; Notable for the following:    Potassium 3.2 (*)    Chloride 100 (*)    Glucose, Bld 192 (*)    All other components within normal limits  CBC  TROPONIN I    ____________________________________________   EKG  ED ECG REPORT I, Lavonia Drafts, the attending physician, personally viewed and interpreted this ECG.  Date: 09/19/2015 EKG Time: 7:48 AM Rate: 80 Rhythm: normal sinus rhythm QRS Axis: normal Intervals: normal ST/T Wave abnormalities: normal Conduction Disturbances: PACs    ____________________________________________     RADIOLOGY  None  ____________________________________________   PROCEDURES  Procedure(s) performed: none  Critical Care performed: none  ____________________________________________   INITIAL IMPRESSION / ASSESSMENT AND PLAN / ED COURSE  Pertinent labs & imaging results that were available during my care of the patient were reviewed by me and considered in my medical decision making (see chart for details).  Patient presents with sensation of heart skipping beats which she attributes to PAC's. Overall she is  well appearing. We will give IV fluids, place her oin the monitor and check labs.  Labs unremarkable.  Discussed patient with Dr. Ubaldo Glassing of Cardiology, he recommends increasing metoprolol to 25 BID. D/w with patient and she agrees, she will f/u with Dr. Nehemiah Massed. Return precautions discussed  ____________________________________________   FINAL CLINICAL IMPRESSION(S) / ED DIAGNOSES  Final diagnoses:  PAC (premature atrial contraction)  Dizziness          Lavonia Drafts, MD 09/19/15 1020

## 2016-05-29 ENCOUNTER — Other Ambulatory Visit: Payer: Self-pay | Admitting: Family Medicine

## 2016-05-29 DIAGNOSIS — M5442 Lumbago with sciatica, left side: Secondary | ICD-10-CM

## 2016-06-01 ENCOUNTER — Ambulatory Visit: Admission: RE | Admit: 2016-06-01 | Payer: BC Managed Care – PPO | Source: Ambulatory Visit

## 2016-06-13 ENCOUNTER — Ambulatory Visit: Admission: RE | Admit: 2016-06-13 | Payer: BC Managed Care – PPO | Source: Ambulatory Visit

## 2016-09-06 ENCOUNTER — Other Ambulatory Visit: Payer: Self-pay | Admitting: Family Medicine

## 2016-09-06 DIAGNOSIS — Z1231 Encounter for screening mammogram for malignant neoplasm of breast: Secondary | ICD-10-CM

## 2016-09-10 ENCOUNTER — Ambulatory Visit
Admission: RE | Admit: 2016-09-10 | Discharge: 2016-09-10 | Disposition: A | Discharge status: Home / Self Care | Payer: BC Managed Care – PPO | Source: Ambulatory Visit | Attending: Family Medicine | Admitting: Family Medicine

## 2016-09-10 DIAGNOSIS — Z1231 Encounter for screening mammogram for malignant neoplasm of breast: Secondary | ICD-10-CM | POA: Insufficient documentation

## 2016-10-08 ENCOUNTER — Other Ambulatory Visit: Payer: Self-pay | Admitting: Otolaryngology

## 2016-10-08 DIAGNOSIS — K1121 Acute sialoadenitis: Secondary | ICD-10-CM

## 2016-10-17 ENCOUNTER — Ambulatory Visit
Admission: RE | Admit: 2016-10-17 | Discharge: 2016-10-17 | Disposition: A | Discharge status: Home / Self Care | Payer: BC Managed Care – PPO | Source: Ambulatory Visit | Attending: Otolaryngology | Admitting: Otolaryngology

## 2016-10-17 DIAGNOSIS — K1121 Acute sialoadenitis: Secondary | ICD-10-CM | POA: Insufficient documentation

## 2016-10-17 DIAGNOSIS — E042 Nontoxic multinodular goiter: Secondary | ICD-10-CM | POA: Diagnosis not present

## 2016-10-17 LAB — POCT I-STAT CREATININE: Creatinine, Ser: 0.6 mg/dL (ref 0.44–1.00)

## 2016-10-17 MED ORDER — IOPAMIDOL (ISOVUE-300) INJECTION 61%
75.0000 mL | Freq: Once | INTRAVENOUS | Status: AC | PRN
Start: 2016-10-17 — End: 2016-10-17
  Administered 2016-10-17: 75 mL via INTRAVENOUS

## 2016-11-06 ENCOUNTER — Encounter: Payer: Self-pay | Admitting: *Deleted

## 2016-11-13 ENCOUNTER — Encounter: Payer: Self-pay | Admitting: *Deleted

## 2016-11-13 ENCOUNTER — Ambulatory Visit
Admission: RE | Admit: 2016-11-13 | Discharge: 2016-11-13 | Disposition: A | Discharge status: Home / Self Care | Payer: BC Managed Care – PPO | Source: Ambulatory Visit | Attending: Ophthalmology | Admitting: Ophthalmology

## 2016-11-13 ENCOUNTER — Encounter
Admission: RE | Disposition: A | Discharge status: Home / Self Care | Payer: Self-pay | Source: Ambulatory Visit | Attending: Ophthalmology

## 2016-11-13 ENCOUNTER — Ambulatory Visit: Payer: BC Managed Care – PPO | Admitting: Anesthesiology

## 2016-11-13 DIAGNOSIS — M1990 Unspecified osteoarthritis, unspecified site: Secondary | ICD-10-CM | POA: Insufficient documentation

## 2016-11-13 DIAGNOSIS — I10 Essential (primary) hypertension: Secondary | ICD-10-CM | POA: Diagnosis not present

## 2016-11-13 DIAGNOSIS — K219 Gastro-esophageal reflux disease without esophagitis: Secondary | ICD-10-CM | POA: Diagnosis not present

## 2016-11-13 DIAGNOSIS — E7800 Pure hypercholesterolemia, unspecified: Secondary | ICD-10-CM | POA: Diagnosis not present

## 2016-11-13 DIAGNOSIS — I4891 Unspecified atrial fibrillation: Secondary | ICD-10-CM | POA: Insufficient documentation

## 2016-11-13 DIAGNOSIS — Z882 Allergy status to sulfonamides status: Secondary | ICD-10-CM | POA: Diagnosis not present

## 2016-11-13 DIAGNOSIS — Z90710 Acquired absence of both cervix and uterus: Secondary | ICD-10-CM | POA: Insufficient documentation

## 2016-11-13 DIAGNOSIS — E119 Type 2 diabetes mellitus without complications: Secondary | ICD-10-CM | POA: Diagnosis not present

## 2016-11-13 DIAGNOSIS — Z7984 Long term (current) use of oral hypoglycemic drugs: Secondary | ICD-10-CM | POA: Diagnosis not present

## 2016-11-13 DIAGNOSIS — H2511 Age-related nuclear cataract, right eye: Secondary | ICD-10-CM | POA: Diagnosis not present

## 2016-11-13 DIAGNOSIS — G4730 Sleep apnea, unspecified: Secondary | ICD-10-CM | POA: Diagnosis not present

## 2016-11-13 DIAGNOSIS — Z79899 Other long term (current) drug therapy: Secondary | ICD-10-CM | POA: Diagnosis not present

## 2016-11-13 DIAGNOSIS — Z9889 Other specified postprocedural states: Secondary | ICD-10-CM | POA: Insufficient documentation

## 2016-11-13 DIAGNOSIS — Z888 Allergy status to other drugs, medicaments and biological substances status: Secondary | ICD-10-CM | POA: Insufficient documentation

## 2016-11-13 HISTORY — DX: Other complications of anesthesia, initial encounter: T88.59XA

## 2016-11-13 HISTORY — DX: Edema, unspecified: R60.9

## 2016-11-13 HISTORY — DX: Nausea with vomiting, unspecified: R11.2

## 2016-11-13 HISTORY — DX: Adverse effect of unspecified anesthetic, initial encounter: T41.45XA

## 2016-11-13 HISTORY — DX: Other specified postprocedural states: Z98.890

## 2016-11-13 HISTORY — DX: Cardiac arrhythmia, unspecified: I49.9

## 2016-11-13 HISTORY — PX: CATARACT EXTRACTION W/PHACO: SHX586

## 2016-11-13 HISTORY — DX: Gastro-esophageal reflux disease without esophagitis: K21.9

## 2016-11-13 LAB — GLUCOSE, CAPILLARY: Glucose-Capillary: 182 mg/dL — ABNORMAL HIGH (ref 65–99)

## 2016-11-13 SURGERY — PHACOEMULSIFICATION, CATARACT, WITH IOL INSERTION
Anesthesia: Monitor Anesthesia Care | Site: Eye | Laterality: Right | Wound class: Clean

## 2016-11-13 MED ORDER — MIDAZOLAM HCL 2 MG/2ML IJ SOLN
INTRAMUSCULAR | Status: DC | PRN
Start: 2016-11-13 — End: 2016-11-13
  Administered 2016-11-13: 1 mg via INTRAVENOUS

## 2016-11-13 MED ORDER — EPINEPHRINE PF 1 MG/ML IJ SOLN
INTRAMUSCULAR | Status: AC
  Filled 2016-11-13: qty 2

## 2016-11-13 MED ORDER — MOXIFLOXACIN HCL 0.5 % OP SOLN: 1.0000 [drp] | OPHTHALMIC | Status: DC | PRN

## 2016-11-13 MED ORDER — POVIDONE-IODINE 5 % OP SOLN
OPHTHALMIC | Status: AC
  Filled 2016-11-13: qty 30

## 2016-11-13 MED ORDER — LIDOCAINE HCL (PF) 4 % IJ SOLN
INTRAMUSCULAR | Status: AC
  Filled 2016-11-13: qty 5

## 2016-11-13 MED ORDER — SODIUM CHLORIDE 0.9 % IV SOLN
INTRAVENOUS | Status: DC
  Administered 2016-11-13: 08:00:00 via INTRAVENOUS

## 2016-11-13 MED ORDER — CARBACHOL 0.01 % IO SOLN
INTRAOCULAR | Status: DC | PRN
  Administered 2016-11-13: 0.5 mL via INTRAOCULAR

## 2016-11-13 MED ORDER — POVIDONE-IODINE 5 % OP SOLN
OPHTHALMIC | Status: DC | PRN
  Administered 2016-11-13: 1 via OPHTHALMIC

## 2016-11-13 MED ORDER — MIDAZOLAM HCL 2 MG/2ML IJ SOLN
INTRAMUSCULAR | Status: AC
  Filled 2016-11-13: qty 2

## 2016-11-13 MED ORDER — FENTANYL CITRATE (PF) 100 MCG/2ML IJ SOLN
INTRAMUSCULAR | Status: DC | PRN
  Administered 2016-11-13: 50 ug via INTRAVENOUS

## 2016-11-13 MED ORDER — MOXIFLOXACIN HCL 0.5 % OP SOLN
OPHTHALMIC | Status: AC
  Filled 2016-11-13: qty 3

## 2016-11-13 MED ORDER — FENTANYL CITRATE (PF) 100 MCG/2ML IJ SOLN
INTRAMUSCULAR | Status: AC
  Filled 2016-11-13: qty 2

## 2016-11-13 MED ORDER — EPINEPHRINE PF 1 MG/ML IJ SOLN
INTRAOCULAR | Status: DC | PRN
  Administered 2016-11-13: 09:00:00 via OPHTHALMIC

## 2016-11-13 MED ORDER — ARMC OPHTHALMIC DILATING DROPS
1.0000 "application " | OPHTHALMIC | Status: DC | PRN
  Administered 2016-11-13 (×3): 1 via OPHTHALMIC

## 2016-11-13 MED ORDER — NA CHONDROIT SULF-NA HYALURON 40-17 MG/ML IO SOLN
INTRAOCULAR | Status: AC
  Filled 2016-11-13: qty 1

## 2016-11-13 MED ORDER — MOXIFLOXACIN HCL 0.5 % OP SOLN
OPHTHALMIC | Status: DC | PRN
  Administered 2016-11-13: 0.2 mL via OPHTHALMIC

## 2016-11-13 MED ORDER — NA CHONDROIT SULF-NA HYALURON 40-17 MG/ML IO SOLN
INTRAOCULAR | Status: DC | PRN
  Administered 2016-11-13: 1 mL via INTRAOCULAR

## 2016-11-13 MED ORDER — LIDOCAINE HCL (PF) 4 % IJ SOLN
INTRAOCULAR | Status: DC | PRN
  Administered 2016-11-13: 4 mL via OPHTHALMIC

## 2016-11-13 MED ORDER — ARMC OPHTHALMIC DILATING DROPS
OPHTHALMIC | Status: AC
  Filled 2016-11-13: qty 0.4

## 2016-11-13 SURGICAL SUPPLY — 16 items
GLOVE BIO SURGEON STRL SZ8 (GLOVE) ×2 IMPLANT
GLOVE BIOGEL M 6.5 STRL (GLOVE) ×2 IMPLANT
GLOVE SURG LX 8.0 MICRO (GLOVE) ×1
GLOVE SURG LX STRL 8.0 MICRO (GLOVE) ×1 IMPLANT
GOWN STRL REUS W/ TWL LRG LVL3 (GOWN DISPOSABLE) ×2 IMPLANT
GOWN STRL REUS W/TWL LRG LVL3 (GOWN DISPOSABLE) ×2
LABEL CATARACT MEDS ST (LABEL) ×2 IMPLANT
LENS IOL TECNIS ITEC 18.0 (Intraocular Lens) ×2 IMPLANT
PACK CATARACT (MISCELLANEOUS) ×2 IMPLANT
PACK CATARACT BRASINGTON LX (MISCELLANEOUS) ×2 IMPLANT
PACK EYE AFTER SURG (MISCELLANEOUS) ×2 IMPLANT
SOL BSS BAG (MISCELLANEOUS) ×2
SOLUTION BSS BAG (MISCELLANEOUS) ×1 IMPLANT
SYR 5ML LL (SYRINGE) ×2 IMPLANT
WATER STERILE IRR 250ML POUR (IV SOLUTION) ×2 IMPLANT
WIPE NON LINTING 3.25X3.25 (MISCELLANEOUS) ×2 IMPLANT

## 2016-11-13 NOTE — Transfer of Care (Signed)
Immediate Anesthesia Transfer of Care Note  Patient: Pamela Nash  Procedure(s) Performed: Procedure(s) with comments: CATARACT EXTRACTION PHACO AND INTRAOCULAR LENS PLACEMENT (IOC) (Right) - [place] 00:41.8 AP% 24.3 CDE 10.13 Fluid pack lot # [id number]  Patient Location: PACU  Anesthesia Type:MAC  Level of Consciousness: awake, alert  and oriented  Airway & Oxygen Therapy: Patient Spontanous Breathing  Post-op Assessment: Report given to RN and Post -op Vital signs reviewed and stable  Post vital signs: Reviewed and stable  Last Vitals:  Vitals:   11/13/16 0706 11/13/16 0859  BP: 135/62 (!) 125/55  Pulse: 67 61  Resp: 14 16  Temp: 36.9 C   SpO2: 99% 96%    Last Pain:  Vitals:   11/13/16 0859  TempSrc: Temporal         Complications: No apparent anesthesia complications

## 2016-11-13 NOTE — Op Note (Signed)
PREOPERATIVE DIAGNOSIS:  Nuclear sclerotic cataract of the right eye.   POSTOPERATIVE DIAGNOSIS:  NUCLEAR SCLEROTIC CATARACT RIGHT EYE   OPERATIVE PROCEDURE: Procedure(s): CATARACT EXTRACTION PHACO AND INTRAOCULAR LENS PLACEMENT (IOC)   SURGEON:  Birder Robson, MD.   ANESTHESIA:  Anesthesiologist: Alvin Critchley, MD CRNA: Darlyne Russian, CRNA  1.      Managed anesthesia care. 2.      0.82ml of Shugarcaine was instilled in the eye following the paracentesis.   COMPLICATIONS:  None.   TECHNIQUE:   Stop and chop   DESCRIPTION OF PROCEDURE:  The patient was examined and consented in the preoperative holding area where the aforementioned topical anesthesia was applied to the right eye and then brought back to the Operating Room where the right eye was prepped and draped in the usual sterile ophthalmic fashion and a lid speculum was placed. A paracentesis was created with the side port blade and the anterior chamber was filled with viscoelastic. A near clear corneal incision was performed with the steel keratome. A continuous curvilinear capsulorrhexis was performed with a cystotome followed by the capsulorrhexis forceps. Hydrodissection and hydrodelineation were carried out with BSS on a blunt cannula. The lens was removed in a stop and chop  technique and the remaining cortical material was removed with the irrigation-aspiration handpiece. The capsular bag was inflated with viscoelastic and the Technis [id number]  lens was placed in the capsular bag without complication. The remaining viscoelastic was removed from the eye with the irrigation-aspiration handpiece. The wounds were hydrated. The anterior chamber was flushed with Miostat and the eye was inflated to physiologic pressure. 0.31ml of Vigamox was placed in the anterior chamber. The wounds were found to be water tight. The eye was dressed with Vigamox. The patient was given protective glasses to wear throughout the day and a shield with which to  sleep tonight. The patient was also given drops with which to begin a drop regimen today and will follow-up with me in one day.  Implant Name Type Inv. Item Serial No. Manufacturer Lot No. LRB No. Used  LENS IOL DIOP 18.0 - [id number] Intraocular Lens LENS IOL DIOP 18.0 [id number] AMO   Right 1   Procedure(s) with comments: CATARACT EXTRACTION PHACO AND INTRAOCULAR LENS PLACEMENT (IOC) (Right) - [place] 00:41.8 AP% 24.3 CDE 10.13 Fluid pack lot # [id number]  Electronically signed: [place] 11/13/2016 8:57 AM

## 2016-11-13 NOTE — Anesthesia Preprocedure Evaluation (Signed)
Anesthesia Evaluation  Patient identified by MRN, date of birth, ID band Patient awake    Reviewed: Allergy & Precautions, NPO status , Patient's Chart, lab work & pertinent test results, reviewed documented beta blocker date and time   History of Anesthesia Complications (+) PONV and history of anesthetic complications  Airway Mallampati: II  TM Distance: <3 FB     Dental   Pulmonary sleep apnea ,    Pulmonary exam normal        Cardiovascular hypertension, Pt. on medications and Pt. on home beta blockers Normal cardiovascular exam+ dysrhythmias Atrial Fibrillation      Neuro/Psych negative neurological ROS  negative psych ROS   GI/Hepatic Neg liver ROS, GERD  Medicated,  Endo/Other  diabetes, Well Controlled, Type 2, Oral Hypoglycemic Agents  Renal/GU negative Renal ROS  negative genitourinary   Musculoskeletal  (+) Arthritis , Osteoarthritis,    Abdominal Normal abdominal exam  (+)   Peds negative pediatric ROS (+)  Hematology negative hematology ROS (+)   Anesthesia Other Findings   Reproductive/Obstetrics                             Anesthesia Physical Anesthesia Plan  ASA: III  Anesthesia Plan: MAC   Post-op Pain Management:    Induction:   PONV Risk Score and Plan:   Airway Management Planned: Nasal Cannula  Additional Equipment:   Intra-op Plan:   Post-operative Plan:   Informed Consent: I have reviewed the patients History and Physical, chart, labs and discussed the procedure including the risks, benefits and alternatives for the proposed anesthesia with the patient or authorized representative who has indicated his/her understanding and acceptance.   Dental advisory given  Plan Discussed with: CRNA and Surgeon  Anesthesia Plan Comments:         Anesthesia Quick Evaluation

## 2016-11-13 NOTE — Discharge Instructions (Signed)
Eye Surgery Discharge Instructions  Expect mild scratchy sensation or mild soreness. DO NOT RUB YOUR EYE!  The day of surgery:  Minimal physical activity, but bed rest is not required  No reading, computer work, or close hand work  No bending, lifting, or straining.  May watch TV  For 24 hours:  No driving, legal decisions, or alcoholic beverages  Safety precautions  Eat anything you prefer: It is better to start with liquids, then soup then solid foods.  _____ Eye patch should be worn until postoperative exam tomorrow.  ____ Solar shield eyeglasses should be worn for comfort in the sunlight/patch while sleeping  Resume all regular medications including aspirin or Coumadin if these were discontinued prior to surgery. You may shower, bathe, shave, or wash your hair. Tylenol may be taken for mild discomfort.  Call your doctor if you experience significant pain, nausea, or vomiting, fever > 101 or other signs of infection. [phone] or [phone] Specific instructions:  Follow-up Information    Birder Robson, MD Follow up.   Specialty:  Ophthalmology Why:  August 22 at 8:40am Contact information: [address] [place] [address] [phone]

## 2016-11-13 NOTE — Anesthesia Procedure Notes (Signed)
Procedure Name: MAC Date/Time: 11/13/2016 8:40 AM Performed by: Darlyne Russian Pre-anesthesia Checklist: Patient identified, Emergency Drugs available, Patient being monitored, Suction available and Timeout performed Patient Re-evaluated:Patient Re-evaluated prior to induction Oxygen Delivery Method: Nasal cannula Placement Confirmation: positive ETCO2

## 2016-11-13 NOTE — Anesthesia Post-op Follow-up Note (Signed)
Anesthesia QCDR form completed.        

## 2016-11-13 NOTE — Anesthesia Postprocedure Evaluation (Signed)
Anesthesia Post Note  Patient: Tonee Ragan Schake  Procedure(s) Performed: Procedure(s) (LRB): CATARACT EXTRACTION PHACO AND INTRAOCULAR LENS PLACEMENT (IOC) (Right)  Patient location during evaluation: PACU Anesthesia Type: MAC Level of consciousness: awake and alert Pain management: pain level controlled Vital Signs Assessment: post-procedure vital signs reviewed and stable Respiratory status: spontaneous breathing, nonlabored ventilation, respiratory function stable and patient connected to nasal cannula oxygen Cardiovascular status: stable and blood pressure returned to baseline Anesthetic complications: no     Last Vitals:  Vitals:   11/13/16 0706 11/13/16 0859  BP: 135/62 (!) (P) 125/55  Pulse: 67 (P) 63  Resp: 14 (P) 16  Temp: 36.9 C (P) 36.5 C  SpO2: 99% (P) 97%    Last Pain:  Vitals:   11/13/16 0859  TempSrc: Temporal                 Darlyne Russian

## 2016-11-13 NOTE — H&P (Signed)
All labs reviewed. Abnormal studies sent to patients PCP when indicated.  Previous H&P reviewed, patient examined, there are NO CHANGES.  Pamela Mulcahey LOUIS8/21/20188:35 AM

## 2016-12-03 ENCOUNTER — Encounter: Payer: Self-pay | Admitting: *Deleted

## 2016-12-04 ENCOUNTER — Ambulatory Visit: Payer: BC Managed Care – PPO | Admitting: Certified Registered Nurse Anesthetist

## 2016-12-04 ENCOUNTER — Encounter: Payer: Self-pay | Admitting: Anesthesiology

## 2016-12-04 ENCOUNTER — Ambulatory Visit
Admission: RE | Admit: 2016-12-04 | Discharge: 2016-12-04 | Disposition: A | Discharge status: Home / Self Care | Payer: BC Managed Care – PPO | Source: Ambulatory Visit | Attending: Ophthalmology | Admitting: Ophthalmology

## 2016-12-04 ENCOUNTER — Encounter
Admission: RE | Disposition: A | Discharge status: Home / Self Care | Payer: Self-pay | Source: Ambulatory Visit | Attending: Ophthalmology

## 2016-12-04 DIAGNOSIS — G4730 Sleep apnea, unspecified: Secondary | ICD-10-CM | POA: Insufficient documentation

## 2016-12-04 DIAGNOSIS — K219 Gastro-esophageal reflux disease without esophagitis: Secondary | ICD-10-CM | POA: Insufficient documentation

## 2016-12-04 DIAGNOSIS — E7800 Pure hypercholesterolemia, unspecified: Secondary | ICD-10-CM | POA: Insufficient documentation

## 2016-12-04 DIAGNOSIS — Z882 Allergy status to sulfonamides status: Secondary | ICD-10-CM | POA: Diagnosis not present

## 2016-12-04 DIAGNOSIS — Z9841 Cataract extraction status, right eye: Secondary | ICD-10-CM | POA: Diagnosis not present

## 2016-12-04 DIAGNOSIS — E1136 Type 2 diabetes mellitus with diabetic cataract: Secondary | ICD-10-CM | POA: Insufficient documentation

## 2016-12-04 DIAGNOSIS — I491 Atrial premature depolarization: Secondary | ICD-10-CM | POA: Diagnosis not present

## 2016-12-04 DIAGNOSIS — M1990 Unspecified osteoarthritis, unspecified site: Secondary | ICD-10-CM | POA: Diagnosis not present

## 2016-12-04 DIAGNOSIS — Z90710 Acquired absence of both cervix and uterus: Secondary | ICD-10-CM | POA: Diagnosis not present

## 2016-12-04 DIAGNOSIS — I10 Essential (primary) hypertension: Secondary | ICD-10-CM | POA: Insufficient documentation

## 2016-12-04 DIAGNOSIS — R609 Edema, unspecified: Secondary | ICD-10-CM | POA: Diagnosis not present

## 2016-12-04 DIAGNOSIS — Z888 Allergy status to other drugs, medicaments and biological substances status: Secondary | ICD-10-CM | POA: Insufficient documentation

## 2016-12-04 DIAGNOSIS — H2512 Age-related nuclear cataract, left eye: Secondary | ICD-10-CM | POA: Insufficient documentation

## 2016-12-04 HISTORY — PX: CATARACT EXTRACTION W/PHACO: SHX586

## 2016-12-04 HISTORY — DX: Dependence on other enabling machines and devices: Z99.89

## 2016-12-04 LAB — GLUCOSE, CAPILLARY: GLUCOSE-CAPILLARY: 149 mg/dL — AB (ref 65–99)

## 2016-12-04 SURGERY — PHACOEMULSIFICATION, CATARACT, WITH IOL INSERTION
Anesthesia: Monitor Anesthesia Care | Site: Eye | Laterality: Left | Wound class: Clean

## 2016-12-04 MED ORDER — NA CHONDROIT SULF-NA HYALURON 40-17 MG/ML IO SOLN
INTRAOCULAR | Status: DC | PRN
  Administered 2016-12-04: 1 mL via INTRAOCULAR

## 2016-12-04 MED ORDER — POVIDONE-IODINE 5 % OP SOLN
OPHTHALMIC | Status: DC | PRN
  Administered 2016-12-04: 1 via OPHTHALMIC

## 2016-12-04 MED ORDER — FENTANYL CITRATE (PF) 100 MCG/2ML IJ SOLN
INTRAMUSCULAR | Status: DC | PRN
Start: 2016-12-04 — End: 2016-12-04
  Administered 2016-12-04 (×2): 25 ug via INTRAVENOUS

## 2016-12-04 MED ORDER — LIDOCAINE HCL (PF) 4 % IJ SOLN
INTRAOCULAR | Status: DC | PRN
  Administered 2016-12-04: 2 mL via OPHTHALMIC

## 2016-12-04 MED ORDER — MIDAZOLAM HCL 2 MG/2ML IJ SOLN
INTRAMUSCULAR | Status: DC | PRN
  Administered 2016-12-04: 1 mg via INTRAVENOUS

## 2016-12-04 MED ORDER — MOXIFLOXACIN HCL 0.5 % OP SOLN: 1.0000 [drp] | OPHTHALMIC | Status: DC | PRN

## 2016-12-04 MED ORDER — MIDAZOLAM HCL 2 MG/2ML IJ SOLN
INTRAMUSCULAR | Status: AC
  Filled 2016-12-04: qty 2

## 2016-12-04 MED ORDER — SODIUM CHLORIDE 0.9 % IV SOLN
INTRAVENOUS | Status: DC
  Administered 2016-12-04: 11:00:00 via INTRAVENOUS

## 2016-12-04 MED ORDER — ONDANSETRON HCL 4 MG/2ML IJ SOLN: 4.0000 mg | Freq: Once | INTRAMUSCULAR | Status: DC | PRN

## 2016-12-04 MED ORDER — POVIDONE-IODINE 5 % OP SOLN
OPHTHALMIC | Status: AC
  Filled 2016-12-04: qty 30

## 2016-12-04 MED ORDER — EPINEPHRINE PF 1 MG/ML IJ SOLN
INTRAOCULAR | Status: DC | PRN
  Administered 2016-12-04: 1 mL via OPHTHALMIC

## 2016-12-04 MED ORDER — NA CHONDROIT SULF-NA HYALURON 40-17 MG/ML IO SOLN
INTRAOCULAR | Status: AC
  Filled 2016-12-04: qty 1

## 2016-12-04 MED ORDER — CARBACHOL 0.01 % IO SOLN
INTRAOCULAR | Status: DC | PRN
  Administered 2016-12-04: .5 mL via INTRAOCULAR

## 2016-12-04 MED ORDER — EPINEPHRINE PF 1 MG/ML IJ SOLN
INTRAMUSCULAR | Status: AC
  Filled 2016-12-04: qty 1

## 2016-12-04 MED ORDER — FENTANYL CITRATE (PF) 100 MCG/2ML IJ SOLN
INTRAMUSCULAR | Status: AC
  Filled 2016-12-04: qty 2

## 2016-12-04 MED ORDER — MOXIFLOXACIN HCL 0.5 % OP SOLN
OPHTHALMIC | Status: DC | PRN
  Administered 2016-12-04: .2 mL via OPHTHALMIC

## 2016-12-04 MED ORDER — FENTANYL CITRATE (PF) 100 MCG/2ML IJ SOLN: 25.0000 ug | INTRAMUSCULAR | Status: DC | PRN

## 2016-12-04 MED ORDER — ARMC OPHTHALMIC DILATING DROPS
1.0000 "application " | OPHTHALMIC | Status: AC
  Administered 2016-12-04 (×3): 1 via OPHTHALMIC

## 2016-12-04 MED ORDER — LIDOCAINE HCL (PF) 4 % IJ SOLN
INTRAMUSCULAR | Status: AC
  Filled 2016-12-04: qty 5

## 2016-12-04 SURGICAL SUPPLY — 16 items
GLOVE BIO SURGEON STRL SZ8 (GLOVE) ×2 IMPLANT
GLOVE BIOGEL M 6.5 STRL (GLOVE) ×2 IMPLANT
GLOVE SURG LX 8.0 MICRO (GLOVE) ×1
GLOVE SURG LX STRL 8.0 MICRO (GLOVE) ×1 IMPLANT
GOWN STRL REUS W/ TWL LRG LVL3 (GOWN DISPOSABLE) ×2 IMPLANT
GOWN STRL REUS W/TWL LRG LVL3 (GOWN DISPOSABLE) ×2
LABEL CATARACT MEDS ST (LABEL) ×2 IMPLANT
LENS IOL TECNIS ITEC 19.5 (Intraocular Lens) ×2 IMPLANT
PACK CATARACT (MISCELLANEOUS) ×2 IMPLANT
PACK CATARACT BRASINGTON LX (MISCELLANEOUS) ×2 IMPLANT
PACK EYE AFTER SURG (MISCELLANEOUS) ×2 IMPLANT
SOL BSS BAG (MISCELLANEOUS) ×2
SOLUTION BSS BAG (MISCELLANEOUS) ×1 IMPLANT
SYR 5ML LL (SYRINGE) ×2 IMPLANT
WATER STERILE IRR 250ML POUR (IV SOLUTION) ×2 IMPLANT
WIPE NON LINTING 3.25X3.25 (MISCELLANEOUS) ×2 IMPLANT

## 2016-12-04 NOTE — Anesthesia Procedure Notes (Signed)
Procedure Name: MAC Date/Time: 12/04/2016 12:07 PM Performed by: Darlyne Russian Pre-anesthesia Checklist: Patient identified, Emergency Drugs available, Suction available, Patient being monitored and Timeout performed Patient Re-evaluated:Patient Re-evaluated prior to induction Oxygen Delivery Method: Nasal cannula Placement Confirmation: positive ETCO2

## 2016-12-04 NOTE — Op Note (Signed)
PREOPERATIVE DIAGNOSIS:  Nuclear sclerotic cataract of the left eye.   POSTOPERATIVE DIAGNOSIS:  Nuclear sclerotic cataract of the left eye.   OPERATIVE PROCEDURE: Procedure(s): CATARACT EXTRACTION PHACO AND INTRAOCULAR LENS PLACEMENT (IOC)   SURGEON:  Birder Robson, MD.   ANESTHESIA:  Anesthesiologist: Martha Clan, MD CRNA: Darlyne Russian, CRNA  1.      Managed anesthesia care. 2.     0.15ml of Shugarcaine was instilled following the paracentesis   COMPLICATIONS:  None.   TECHNIQUE:   Stop and chop   DESCRIPTION OF PROCEDURE:  The patient was examined and consented in the preoperative holding area where the aforementioned topical anesthesia was applied to the left eye and then brought back to the Operating Room where the left eye was prepped and draped in the usual sterile ophthalmic fashion and a lid speculum was placed. A paracentesis was created with the side port blade and the anterior chamber was filled with viscoelastic. A near clear corneal incision was performed with the steel keratome. A continuous curvilinear capsulorrhexis was performed with a cystotome followed by the capsulorrhexis forceps. Hydrodissection and hydrodelineation were carried out with BSS on a blunt cannula. The lens was removed in a stop and chop  technique and the remaining cortical material was removed with the irrigation-aspiration handpiece. The capsular bag was inflated with viscoelastic and the Technis [id number] lens was placed in the capsular bag without complication. The remaining viscoelastic was removed from the eye with the irrigation-aspiration handpiece. The wounds were hydrated. The anterior chamber was flushed with Miostat and the eye was inflated to physiologic pressure. 0.17ml Vigamox was placed in the anterior chamber. The wounds were found to be water tight. The eye was dressed with Vigamox. The patient was given protective glasses to wear throughout the day and a shield with which to sleep  tonight. The patient was also given drops with which to begin a drop regimen today and will follow-up with me in one day.  Implant Name Type Inv. Item Serial No. Manufacturer Lot No. LRB No. Used  LENS IOL DIOP 19.5 - [id number] Intraocular Lens LENS IOL DIOP 19.5 [id number] AMO   Left 1    Procedure(s) with comments: CATARACT EXTRACTION PHACO AND INTRAOCULAR LENS PLACEMENT (IOC) (Left) - [place] 00:39 AP% 16.2 CDE 6.34 fluid pack lot # [id number]  Electronically signed: [place] 12/04/2016 12:27 PM

## 2016-12-04 NOTE — Anesthesia Post-op Follow-up Note (Signed)
Anesthesia QCDR form completed.        

## 2016-12-04 NOTE — Anesthesia Postprocedure Evaluation (Signed)
Anesthesia Post Note  Patient: Pamela Nash  Procedure(s) Performed: Procedure(s) (LRB): CATARACT EXTRACTION PHACO AND INTRAOCULAR LENS PLACEMENT (IOC) (Left)  Patient location during evaluation: PACU Anesthesia Type: MAC Level of consciousness: awake and alert Pain management: pain level controlled Vital Signs Assessment: post-procedure vital signs reviewed and stable Respiratory status: spontaneous breathing, nonlabored ventilation, respiratory function stable and patient connected to nasal cannula oxygen Cardiovascular status: stable and blood pressure returned to baseline Anesthetic complications: no     Last Vitals:  Vitals:   12/04/16 1229 12/04/16 1230  BP: (!) 118/54 (!) 118/54  Pulse: 63 64  Resp: 18 16  Temp: 36.6 C   SpO2: 99% 98%    Last Pain:  Vitals:   12/04/16 1230  TempSrc: Oral                 Darlyne Russian

## 2016-12-04 NOTE — Transfer of Care (Signed)
Immediate Anesthesia Transfer of Care Note  Patient: Pamela Nash  Procedure(s) Performed: Procedure(s) with comments: CATARACT EXTRACTION PHACO AND INTRAOCULAR LENS PLACEMENT (IOC) (Left) - [place] 00:39 AP% 16.2 CDE 6.34 fluid pack lot # [id number]  Patient Location: PACU  Anesthesia Type:MAC  Level of Consciousness: awake, alert  and oriented  Airway & Oxygen Therapy: Patient Spontanous Breathing  Post-op Assessment: Report given to RN and Post -op Vital signs reviewed and stable  Post vital signs: Reviewed and stable  Last Vitals:  Vitals:   12/04/16 1229 12/04/16 1230  BP: (!) 118/54 (!) 118/54  Pulse: 63 64  Resp: 18 16  Temp: 36.6 C   SpO2: 99% 98%    Last Pain:  Vitals:   12/04/16 1230  TempSrc: Oral         Complications: No apparent anesthesia complications

## 2016-12-04 NOTE — Anesthesia Preprocedure Evaluation (Signed)
Anesthesia Evaluation  Patient identified by MRN, date of birth, ID band Patient awake    Reviewed: Allergy & Precautions, NPO status , Patient's Chart, lab work & pertinent test results, reviewed documented beta blocker date and time   History of Anesthesia Complications (+) PONV and history of anesthetic complications  Airway Mallampati: II  TM Distance: <3 FB     Dental   Pulmonary sleep apnea ,    Pulmonary exam normal        Cardiovascular hypertension, Pt. on medications and Pt. on home beta blockers Normal cardiovascular exam+ dysrhythmias Atrial Fibrillation      Neuro/Psych negative neurological ROS  negative psych ROS   GI/Hepatic Neg liver ROS, GERD  Medicated,  Endo/Other  diabetes, Well Controlled, Type 2, Oral Hypoglycemic Agents  Renal/GU negative Renal ROS  negative genitourinary   Musculoskeletal  (+) Arthritis , Osteoarthritis,    Abdominal Normal abdominal exam  (+)   Peds negative pediatric ROS (+)  Hematology negative hematology ROS (+)   Anesthesia Other Findings   Reproductive/Obstetrics                             Anesthesia Physical Anesthesia Plan  ASA: III  Anesthesia Plan: MAC   Post-op Pain Management:    Induction:   PONV Risk Score and Plan:   Airway Management Planned: Nasal Cannula  Additional Equipment:   Intra-op Plan:   Post-operative Plan:   Informed Consent: I have reviewed the patients History and Physical, chart, labs and discussed the procedure including the risks, benefits and alternatives for the proposed anesthesia with the patient or authorized representative who has indicated his/her understanding and acceptance.   Dental advisory given  Plan Discussed with: CRNA and Surgeon  Anesthesia Plan Comments:         Anesthesia Quick Evaluation  

## 2016-12-04 NOTE — Discharge Instructions (Signed)
Eye Surgery Discharge Instructions  Expect mild scratchy sensation or mild soreness. DO NOT RUB YOUR EYE!  The day of surgery:  Minimal physical activity, but bed rest is not required  No reading, computer work, or close hand work  No bending, lifting, or straining.  May watch TV  For 24 hours:  No driving, legal decisions, or alcoholic beverages  Safety precautions  Eat anything you prefer: It is better to start with liquids, then soup then solid foods.  _____ Eye patch should be worn until postoperative exam tomorrow.  ____ Solar shield eyeglasses should be worn for comfort in the sunlight/patch while sleeping  Resume all regular medications including aspirin or Coumadin if these were discontinued prior to surgery. You may shower, bathe, shave, or wash your hair. Tylenol may be taken for mild discomfort.  Call your doctor if you experience significant pain, nausea, or vomiting, fever > 101 or other signs of infection. [phone] or [phone] Specific instructions:  Follow-up Information    Birder Robson, MD Follow up on 12/05/2016.   Specialty:  Ophthalmology Why:  10:40 am Contact information: [address] [place] [address] [phone]

## 2016-12-04 NOTE — H&P (Signed)
All labs reviewed. Abnormal studies sent to patients PCP when indicated.  Previous H&P reviewed, patient examined, there are NO CHANGES.  Roseanne Juenger LOUIS9/11/201812:03 PM

## 2016-12-05 ENCOUNTER — Encounter: Payer: Self-pay | Admitting: Ophthalmology

## 2017-06-24 DIAGNOSIS — N951 Menopausal and female climacteric states: Secondary | ICD-10-CM | POA: Insufficient documentation

## 2017-07-15 ENCOUNTER — Other Ambulatory Visit
Admission: RE | Admit: 2017-07-15 | Discharge: 2017-07-15 | Disposition: A | Discharge status: Home / Self Care | Payer: Medicare Other | Source: Ambulatory Visit | Attending: Gastroenterology | Admitting: Gastroenterology

## 2017-07-15 DIAGNOSIS — K580 Irritable bowel syndrome with diarrhea: Secondary | ICD-10-CM | POA: Insufficient documentation

## 2017-07-15 DIAGNOSIS — Z1589 Genetic susceptibility to other disease: Secondary | ICD-10-CM | POA: Diagnosis not present

## 2017-07-16 DIAGNOSIS — M8589 Other specified disorders of bone density and structure, multiple sites: Secondary | ICD-10-CM | POA: Insufficient documentation

## 2017-07-18 LAB — MISCELLANEOUS TEST

## 2017-08-13 ENCOUNTER — Other Ambulatory Visit: Payer: Self-pay | Admitting: Family Medicine

## 2017-08-13 DIAGNOSIS — Z1231 Encounter for screening mammogram for malignant neoplasm of breast: Secondary | ICD-10-CM

## 2017-09-16 ENCOUNTER — Ambulatory Visit
Admission: RE | Admit: 2017-09-16 | Discharge: 2017-09-16 | Disposition: A | Discharge status: Home / Self Care | Payer: Medicare Other | Source: Ambulatory Visit | Attending: Family Medicine | Admitting: Family Medicine

## 2017-09-16 DIAGNOSIS — Z1231 Encounter for screening mammogram for malignant neoplasm of breast: Secondary | ICD-10-CM | POA: Insufficient documentation

## 2017-12-13 ENCOUNTER — Encounter: Payer: Self-pay | Admitting: *Deleted

## 2017-12-16 ENCOUNTER — Encounter
Admission: RE | Disposition: A | Discharge status: Home / Self Care | Payer: Self-pay | Source: Ambulatory Visit | Attending: Gastroenterology

## 2017-12-16 ENCOUNTER — Ambulatory Visit: Payer: Medicare Other | Admitting: Certified Registered Nurse Anesthetist

## 2017-12-16 ENCOUNTER — Encounter: Payer: Self-pay | Admitting: Certified Registered Nurse Anesthetist

## 2017-12-16 ENCOUNTER — Ambulatory Visit
Admission: RE | Admit: 2017-12-16 | Discharge: 2017-12-16 | Disposition: A | Discharge status: Home / Self Care | Payer: Medicare Other | Source: Ambulatory Visit | Attending: Gastroenterology | Admitting: Gastroenterology

## 2017-12-16 DIAGNOSIS — K5730 Diverticulosis of large intestine without perforation or abscess without bleeding: Secondary | ICD-10-CM | POA: Insufficient documentation

## 2017-12-16 DIAGNOSIS — K449 Diaphragmatic hernia without obstruction or gangrene: Secondary | ICD-10-CM | POA: Diagnosis not present

## 2017-12-16 DIAGNOSIS — K319 Disease of stomach and duodenum, unspecified: Secondary | ICD-10-CM | POA: Diagnosis not present

## 2017-12-16 DIAGNOSIS — Z888 Allergy status to other drugs, medicaments and biological substances status: Secondary | ICD-10-CM | POA: Diagnosis not present

## 2017-12-16 DIAGNOSIS — M1990 Unspecified osteoarthritis, unspecified site: Secondary | ICD-10-CM | POA: Insufficient documentation

## 2017-12-16 DIAGNOSIS — I10 Essential (primary) hypertension: Secondary | ICD-10-CM | POA: Diagnosis not present

## 2017-12-16 DIAGNOSIS — K210 Gastro-esophageal reflux disease with esophagitis: Secondary | ICD-10-CM | POA: Insufficient documentation

## 2017-12-16 DIAGNOSIS — G4730 Sleep apnea, unspecified: Secondary | ICD-10-CM | POA: Insufficient documentation

## 2017-12-16 DIAGNOSIS — Z882 Allergy status to sulfonamides status: Secondary | ICD-10-CM | POA: Insufficient documentation

## 2017-12-16 DIAGNOSIS — E7800 Pure hypercholesterolemia, unspecified: Secondary | ICD-10-CM | POA: Diagnosis not present

## 2017-12-16 DIAGNOSIS — Z79899 Other long term (current) drug therapy: Secondary | ICD-10-CM | POA: Diagnosis not present

## 2017-12-16 DIAGNOSIS — K621 Rectal polyp: Secondary | ICD-10-CM | POA: Diagnosis not present

## 2017-12-16 DIAGNOSIS — Z7984 Long term (current) use of oral hypoglycemic drugs: Secondary | ICD-10-CM | POA: Diagnosis not present

## 2017-12-16 DIAGNOSIS — E119 Type 2 diabetes mellitus without complications: Secondary | ICD-10-CM | POA: Insufficient documentation

## 2017-12-16 DIAGNOSIS — E669 Obesity, unspecified: Secondary | ICD-10-CM | POA: Diagnosis not present

## 2017-12-16 DIAGNOSIS — Z6827 Body mass index (BMI) 27.0-27.9, adult: Secondary | ICD-10-CM | POA: Insufficient documentation

## 2017-12-16 DIAGNOSIS — Z8371 Family history of colonic polyps: Secondary | ICD-10-CM | POA: Diagnosis present

## 2017-12-16 DIAGNOSIS — D122 Benign neoplasm of ascending colon: Secondary | ICD-10-CM | POA: Diagnosis not present

## 2017-12-16 DIAGNOSIS — K589 Irritable bowel syndrome without diarrhea: Secondary | ICD-10-CM | POA: Insufficient documentation

## 2017-12-16 DIAGNOSIS — Z881 Allergy status to other antibiotic agents status: Secondary | ICD-10-CM | POA: Insufficient documentation

## 2017-12-16 DIAGNOSIS — I4892 Unspecified atrial flutter: Secondary | ICD-10-CM | POA: Insufficient documentation

## 2017-12-16 DIAGNOSIS — Z1211 Encounter for screening for malignant neoplasm of colon: Secondary | ICD-10-CM | POA: Diagnosis not present

## 2017-12-16 DIAGNOSIS — Z7982 Long term (current) use of aspirin: Secondary | ICD-10-CM | POA: Diagnosis not present

## 2017-12-16 DIAGNOSIS — D123 Benign neoplasm of transverse colon: Secondary | ICD-10-CM | POA: Diagnosis not present

## 2017-12-16 HISTORY — PX: ESOPHAGOGASTRODUODENOSCOPY: SHX5428

## 2017-12-16 HISTORY — DX: Sleep apnea, unspecified: G47.30

## 2017-12-16 HISTORY — PX: COLONOSCOPY WITH PROPOFOL: SHX5780

## 2017-12-16 LAB — GLUCOSE, CAPILLARY: Glucose-Capillary: 146 mg/dL — ABNORMAL HIGH (ref 70–99)

## 2017-12-16 SURGERY — EGD (ESOPHAGOGASTRODUODENOSCOPY)
Anesthesia: General

## 2017-12-16 MED ORDER — LIDOCAINE HCL (PF) 2 % IJ SOLN
INTRAMUSCULAR | Status: AC
  Filled 2017-12-16: qty 10

## 2017-12-16 MED ORDER — LIDOCAINE HCL (PF) 1 % IJ SOLN
2.0000 mL | Freq: Once | INTRAMUSCULAR | Status: AC
  Administered 2017-12-16: 0.3 mL via INTRADERMAL

## 2017-12-16 MED ORDER — PROPOFOL 10 MG/ML IV BOLUS
INTRAVENOUS | Status: DC | PRN
  Administered 2017-12-16: 100 mg via INTRAVENOUS
  Administered 2017-12-16: 20 mg via INTRAVENOUS

## 2017-12-16 MED ORDER — PROPOFOL 10 MG/ML IV BOLUS
INTRAVENOUS | Status: AC
  Filled 2017-12-16: qty 20

## 2017-12-16 MED ORDER — LIDOCAINE HCL (PF) 1 % IJ SOLN
INTRAMUSCULAR | Status: AC
  Administered 2017-12-16: 0.3 mL via INTRADERMAL
  Filled 2017-12-16: qty 2

## 2017-12-16 MED ORDER — LIDOCAINE HCL (CARDIAC) PF 100 MG/5ML IV SOSY
PREFILLED_SYRINGE | INTRAVENOUS | Status: DC | PRN
  Administered 2017-12-16: 50 mg via INTRAVENOUS

## 2017-12-16 MED ORDER — PROPOFOL 500 MG/50ML IV EMUL
INTRAVENOUS | Status: DC | PRN
  Administered 2017-12-16: 135 ug/kg/min via INTRAVENOUS

## 2017-12-16 MED ORDER — PROPOFOL 500 MG/50ML IV EMUL
INTRAVENOUS | Status: AC
  Filled 2017-12-16: qty 50

## 2017-12-16 MED ORDER — SODIUM CHLORIDE 0.9 % IV SOLN
INTRAVENOUS | Status: DC
  Administered 2017-12-16: 1000 mL via INTRAVENOUS

## 2017-12-16 NOTE — H&P (Signed)
Outpatient short stay form Pre-procedure 12/16/2017 7:55 AM Pamela Sails MD  Primary Physician: Dr. Dion Body  Reason for visit: EGD and colonoscopy  History of present illness: Patient is a 67 year old female presenting today as above.  She has personal history of irritable bowel syndrome however is recently been found to have a mildly abnormal celiac panel.  There is also family history of colon polyps in a primary relative.  Patient's last colonoscopy was in 2014 this showed no evidence of colon polyps.  She has not had an upper scope since 2002.  She has held her 81 mg aspirin.  She takes no other aspirin products or blood thinning agent.  She tolerated prep well.    Current Facility-Administered Medications:  .  0.9 %  sodium chloride infusion, , Intravenous, Continuous, Pamela Sails, MD .  lidocaine (PF) (XYLOCAINE) 1 % injection 2 mL, 2 mL, Intradermal, Once, Pamela Sails, MD .  lidocaine (PF) (XYLOCAINE) 1 % injection, , , ,   Medications Prior to Admission  Medication Sig Dispense Refill Last Dose  . alendronate (FOSAMAX) 70 MG tablet Take 70 mg by mouth once a week. Take with a full glass of water on an empty stomach.     . dicyclomine (BENTYL) 10 MG capsule Take 10 mg by mouth 4 (four) times daily as needed for spasms.     . hydrochlorothiazide (HYDRODIURIL) 12.5 MG tablet Take 12.5 mg by mouth daily. Along with losartan 50 mg     . hydrochlorothiazide (MICROZIDE) 12.5 MG capsule Take 12.5 mg by mouth daily.    at 0600  . acetaminophen (TYLENOL) 500 MG tablet Take 1,000 mg by mouth 2 (two) times daily as needed for moderate pain or headache.   12/03/2016 at Unknown time  . aspirin 81 MG tablet Take 81 mg by mouth daily.   12/04/2016 at Unknown time  . calcium carbonate (TUMS - DOSED IN MG ELEMENTAL CALCIUM) 500 MG chewable tablet Chew 1 tablet by mouth daily as needed for indigestion or heartburn.   12/03/2016 at Unknown time  . Cholecalciferol (VITAMIN D-3)  1000 UNITS CAPS Take 1,000 Units by mouth daily.    12/04/2016 at Unknown time  . Difluprednate (DUREZOL) 0.05 % EMUL Place 1 drop into the right eye daily.   12/04/2016 at Unknown time  . glimepiride (AMARYL) 2 MG tablet Take 2 mg by mouth daily with lunch.   12/03/2016 at Unknown time  . ibuprofen (ADVIL,MOTRIN) 200 MG tablet Take 400 mg by mouth 2 (two) times daily as needed for headache or moderate pain.   Past Month at Unknown time  . losartan-hydrochlorothiazide (HYZAAR) 50-12.5 MG per tablet Take 1 tablet by mouth daily.    12/16/2017 at 0600  . metFORMIN (GLUCOPHAGE-XR) 500 MG 24 hr tablet Take 1,000 mg by mouth 2 (two) times daily.   12/02/2016  . metoprolol tartrate (LOPRESSOR) 25 MG tablet Take 25 mg by mouth 2 (two) times daily.    12/16/2017 at 0600  . NEVANAC 0.1 % ophthalmic suspension Place 1 drop into the right eye 2 (two) times daily.   12/04/2016 at Unknown time  . ONE TOUCH ULTRA TEST test strip    12/04/2016 at Unknown time  . ONETOUCH DELICA LANCETS 37S MISC    12/04/2016 at Unknown time  . rosuvastatin (CRESTOR) 10 MG tablet Take 10 mg by mouth every evening.    12/03/2016 at Unknown time  . VIVELLE-DOT 0.1 MG/24HR Place 1 patch onto the skin 2 (  two) times a week. Wednesdays and Saturdays   12/03/2016 at Unknown time     Allergies  Allergen Reactions  . Cefuroxime Axetil Swelling    Felt like throat was swelling; headaches  . Lisinopril     cough  . Losartan Potassium     Weight gain (hctz component negates the reaction)   . Pioglitazone Nausea And Vomiting and Rash    Med caused weight gain  . Sulfa Antibiotics Hives and Rash     Past Medical History:  Diagnosis Date  . Arthritis   . Atrial flutter ([place])    c-pap dependent  . Breast screening, unspecified   . Complication of anesthesia   . CPAP (continuous positive airway pressure) dependence    FOR PAC'S  . Diabetes ([place])    non-insulin dependent  . Diffuse cystic mastopathy   . Dysrhythmia    PAC'S  . Edema     ANKLES  . Family history of malignant neoplasm of breast   . GERD (gastroesophageal reflux disease)   . High cholesterol   . Obesity, unspecified   . Personal history of colonic polyps   . PONV (postoperative nausea and vomiting)   . Sleep apnea   . Special screening for malignant neoplasms, colon   . Unspecified essential hypertension     Review of systems:      Physical Exam    Heart and lungs: Regular rate and rhythm without rub or gallop, lungs are bilaterally clear.    HEENT: Normal cephalic atraumatic eyes are anicteric    Other:    Pertinant exam for procedure: Soft nontender nondistended bowel sounds positive normoactive    Planned proceedures: EGD, colonoscopy and indicated procedures. I have discussed the risks benefits and complications of procedures to include not limited to bleeding, infection, perforation and the risk of sedation and the patient wishes to proceed.    Pamela Sails, MD Gastroenterology 12/16/2017  7:55 AM

## 2017-12-16 NOTE — Op Note (Addendum)
[facility] Gastroenterology Patient Name: Pamela Nash Procedure Date: 12/16/2017 8:06 AM MRN: [id number] Account #: [id number] Date of Birth: [birth year] Admit Type: Outpatient Age: 67 Room: [facility] ENDO ROOM 1 Gender: Female Note Status: Finalized Procedure:            Colonoscopy Indications:          Family history of colonic polyps in a first-degree                        relative Providers:            Lollie Sails, MD Referring MD:         Dion Body (Referring MD) Medicines:            Monitored Anesthesia Care Complications:        No immediate complications. Procedure:            Pre-Anesthesia Assessment:                       - ASA Grade Assessment: III - A patient with severe                        systemic disease.                       After obtaining informed consent, the colonoscope was                        passed under direct vision. Throughout the procedure,                        the patient's blood pressure, pulse, and oxygen                        saturations were monitored continuously. The was                        introduced through the anus and advanced to the the                        cecum, identified by appendiceal orifice and ileocecal                        valve. The colonoscopy was performed with moderate                        difficulty due to significant looping. Successful                        completion of the procedure was aided by changing the                        patient to a supine position, changing the patient to a                        prone position and using manual pressure. The patient                        tolerated the procedure well. Findings:      A few small-mouthed diverticula were found in the sigmoid colon and  distal descending colon.      A 2 mm polyp was found in the rectum. The polyp was sessile. The polyp       was removed with a cold biopsy forceps. Resection and retrieval  were       complete.      A 4 mm polyp was found in the ascending colon. The polyp was sessile.       The polyp was removed with a piecemeal technique using a cold biopsy       forceps. Resection and retrieval were complete.      A 3 mm polyp was found in the transverse colon. The polyp was sessile.       The polyp was removed with a cold biopsy forceps. Resection and       retrieval were complete.      Biopsies for histology were taken with a cold forceps from the right       colon and left colon for evaluation of microscopic colitis.      The retroflexed view of the distal rectum and anal verge was normal and       showed no anal or rectal abnormalities, note prominant anal pillars. Impression:           - Diverticulosis in the sigmoid colon and in the distal                        descending colon.                       - One 2 mm polyp in the rectum, removed with a cold                        biopsy forceps. Resected and retrieved.                       - One 4 mm polyp in the ascending colon, removed                        piecemeal using a cold biopsy forceps. Resected and                        retrieved.                       - One 3 mm polyp in the transverse colon, removed with                        a cold biopsy forceps. Resected and retrieved.                       - Biopsies were taken with a cold forceps from the                        right colon and left colon for evaluation of                        microscopic colitis. Recommendation:       - Discharge patient to home.                       - Await pathology results.                       -  Return to GI clinic in 5 weeks.                       - Telephone GI clinic for pathology results in 1 week. Procedure Code(s):    --- Professional ---                       [phone], Colonoscopy, flexible; with biopsy, single or                        multiple Diagnosis Code(s):    --- Professional ---                       K62.1,  Rectal polyp                       D12.2, Benign neoplasm of ascending colon                       D12.3, Benign neoplasm of transverse colon (hepatic                        flexure or splenic flexure)                       Z83.71, Family history of colonic polyps                       K57.30, Diverticulosis of large intestine without                        perforation or abscess without bleeding CPT copyright 2017 American Medical Association. All rights reserved. The codes documented in this report are preliminary and upon coder review may  be revised to meet current compliance requirements. Lollie Sails, MD 12/16/2017 9:09:24 AM This report has been signed electronically. Number of Addenda: 0 Note Initiated On: 12/16/2017 8:06 AM Scope Withdrawal Time: 0 hours 11 minutes 8 seconds  Total Procedure Duration: 0 hours 31 minutes 32 seconds       [facility]

## 2017-12-16 NOTE — Anesthesia Post-op Follow-up Note (Signed)
Anesthesia QCDR form completed.        

## 2017-12-16 NOTE — Anesthesia Postprocedure Evaluation (Signed)
Anesthesia Post Note  Patient: Pamela Nash  Procedure(s) Performed: ESOPHAGOGASTRODUODENOSCOPY (EGD) (N/A ) COLONOSCOPY WITH PROPOFOL (N/A )  Patient location during evaluation: Endoscopy Anesthesia Type: General Level of consciousness: awake and alert Pain management: pain level controlled Vital Signs Assessment: post-procedure vital signs reviewed and stable Respiratory status: spontaneous breathing, nonlabored ventilation, respiratory function stable and patient connected to nasal cannula oxygen Cardiovascular status: blood pressure returned to baseline and stable Postop Assessment: no apparent nausea or vomiting Anesthetic complications: no     Last Vitals:  Vitals:   12/16/17 0929 12/16/17 0939  BP: 109/64 (!) 114/58  Pulse: 62 68  Resp: 15 12  Temp:    SpO2: 99% 98%    Last Pain:  Vitals:   12/16/17 0939  TempSrc:   PainSc: 0-No pain                 Precious Haws Piscitello

## 2017-12-16 NOTE — Transfer of Care (Signed)
Immediate Anesthesia Transfer of Care Note  Patient: Pamela Nash  Procedure(s) Performed: ESOPHAGOGASTRODUODENOSCOPY (EGD) (N/A ) COLONOSCOPY WITH PROPOFOL (N/A )  Patient Location: PACU and Endoscopy Unit  Anesthesia Type:General  Level of Consciousness: drowsy  Airway & Oxygen Therapy: Patient Spontanous Breathing and Patient connected to nasal cannula oxygen  Post-op Assessment: Report given to RN and Post -op Vital signs reviewed and stable  Post vital signs: Reviewed and stable  Last Vitals:  Vitals Value Taken Time  BP 104/55 12/16/2017  9:09 AM  Temp    Pulse 67 12/16/2017  9:09 AM  Resp 15 12/16/2017  9:09 AM  SpO2 91 % 12/16/2017  9:09 AM  Vitals shown include unvalidated device data.  Last Pain:  Vitals:   12/16/17 0742  TempSrc: Tympanic  PainSc: 0-No pain         Complications: No apparent anesthesia complications

## 2017-12-16 NOTE — Anesthesia Preprocedure Evaluation (Signed)
Anesthesia Evaluation  Patient identified by MRN, date of birth, ID band Patient awake    Reviewed: Allergy & Precautions, H&P , NPO status , Patient's Chart, lab work & pertinent test results  History of Anesthesia Complications (+) PONV and history of anesthetic complications  Airway Mallampati: III  TM Distance: <3 FB Neck ROM: limited    Dental  (+) Chipped   Pulmonary neg shortness of breath, sleep apnea and Continuous Positive Airway Pressure Ventilation ,           Cardiovascular Exercise Tolerance: Good hypertension, (-) angina(-) Past MI and (-) DOE + dysrhythmias      Neuro/Psych negative neurological ROS  negative psych ROS   GI/Hepatic Neg liver ROS, GERD  Medicated and Controlled,  Endo/Other  diabetes, Type 2, Oral Hypoglycemic Agents  Renal/GU negative Renal ROS  negative genitourinary   Musculoskeletal  (+) Arthritis ,   Abdominal   Peds  Hematology negative hematology ROS (+)   Anesthesia Other Findings Past Medical History: No date: Arthritis No date: Atrial flutter ([facility])     Comment:  c-pap dependent No date: Breast screening, unspecified No date: Complication of anesthesia No date: CPAP (continuous positive airway pressure) dependence     Comment:  FOR PAC'S No date: Diabetes ([facility])     Comment:  non-insulin dependent No date: Diffuse cystic mastopathy No date: Dysrhythmia     Comment:  PAC'S No date: Edema     Comment:  ANKLES No date: Family history of malignant neoplasm of breast No date: GERD (gastroesophageal reflux disease) No date: High cholesterol No date: Obesity, unspecified No date: Personal history of colonic polyps No date: PONV (postoperative nausea and vomiting) No date: Sleep apnea No date: Special screening for malignant neoplasms, colon No date: Unspecified essential hypertension  Past Surgical History: 1998: ABDOMINAL HYSTERECTOMY 2016: BREAST BIOPSY; Right   Comment:  W/CLIP - NEG 1991: BREAST EXCISIONAL BIOPSY; Right     Comment:  NEG 1995: BREAST EXCISIONAL BIOPSY; Right     Comment:  NEG 11/13/2016: CATARACT EXTRACTION W/PHACO; Right     Comment:  Procedure: CATARACT EXTRACTION PHACO AND INTRAOCULAR               LENS PLACEMENT (IOC);  Surgeon: Birder Robson, MD;                Location: [facility] ORS;  Service: Ophthalmology;  Laterality:              Right;  [place] 00:41.8 AP% 24.3 CDE 10.13 Fluid pack lot #              [id number] 12/04/2016: CATARACT EXTRACTION W/PHACO; Left     Comment:  Procedure: CATARACT EXTRACTION PHACO AND INTRAOCULAR               LENS PLACEMENT (IOC);  Surgeon: Birder Robson, MD;                Location: [facility] ORS;  Service: Ophthalmology;  Laterality:              Left;  [place] 00:39 AP% 16.2 CDE 6.34 fluid pack lot #               [id number] 2010: COLONOSCOPY No date: COLONOSCOPY     Comment:  x3 last 01/23/2013 No date: DILATION AND CURETTAGE OF UTERUS 1978: LAPAROSCOPY 1978: PILONIDAL CYST / SINUS EXCISION 1983: TUBAL LIGATION  BMI    Body Mass Index:  27.46 kg/m      Reproductive/Obstetrics  negative OB ROS                             Anesthesia Physical Anesthesia Plan  ASA: III  Anesthesia Plan: General   Post-op Pain Management:    Induction: Intravenous  PONV Risk Score and Plan: Propofol infusion and TIVA  Airway Management Planned: Natural Airway and Nasal Cannula  Additional Equipment:   Intra-op Plan:   Post-operative Plan:   Informed Consent: I have reviewed the patients History and Physical, chart, labs and discussed the procedure including the risks, benefits and alternatives for the proposed anesthesia with the patient or authorized representative who has indicated his/her understanding and acceptance.   Dental Advisory Given  Plan Discussed with: Anesthesiologist, CRNA and Surgeon  Anesthesia Plan Comments: (Patient consented for risks of  anesthesia including but not limited to:  - adverse reactions to medications - risk of intubation if required - damage to teeth, lips or other oral mucosa - sore throat or hoarseness - Damage to heart, brain, lungs or loss of life  Patient voiced understanding.)        Anesthesia Quick Evaluation

## 2017-12-16 NOTE — Op Note (Signed)
[facility] Gastroenterology Patient Name: Pamela Nash Procedure Date: 12/16/2017 8:06 AM MRN: [id number] Account #: [id number] Date of Birth: 02-16-1951 Admit Type: Outpatient Age: 67 Room: [facility] ENDO ROOM 1 Gender: Female Note Status: Finalized Procedure:            Upper GI endoscopy Providers:            Lollie Sails, MD Referring MD:         Dion Body (Referring MD) Medicines:            Monitored Anesthesia Care Complications:        No immediate complications. Procedure:            Pre-Anesthesia Assessment:                       - ASA Grade Assessment: III - A patient with severe                        systemic disease.                       After obtaining informed consent, the endoscope was                        passed under direct vision. Throughout the procedure,                        the patient's blood pressure, pulse, and oxygen                        saturations were monitored continuously. The Endoscope                        was introduced through the mouth, and advanced to the                        third part of duodenum. The upper GI endoscopy was                        accomplished without difficulty. The patient tolerated                        the procedure well. Findings:      LA Grade B (one or more mucosal breaks greater than 5 mm, not extending       between the tops of two mucosal folds) esophagitis with no bleeding was       found. Biopsies were taken with a cold forceps for histology.      Patchy mild inflammation characterized by erosions was found in the       gastric antrum. Biopsies were taken with a cold forceps for histology.      The cardia and gastric fundus were normal on retroflexion.      Patchey mucosal flattening was found in the entire examined duodenum.       Biopsies for histology were taken with a cold forceps for evaluation of       celiac disease.      A small hiatal hernia was found. The Z-line  was a variable distance from       incisors; the hiatal hernia was sliding. Impression:           -  LA Grade B erosive esophagitis. Biopsied.                       - Erosive gastritis. Biopsied.                       - Flattened mucosa was found in the duodenum,                        suspicious for celiac disease. Biopsied. Recommendation:       - Await pathology results.                       - Return to GI clinic in 5 weeks. Procedure Code(s):    --- Professional ---                       [phone], Esophagogastroduodenoscopy, flexible, transoral;                        with biopsy, single or multiple Diagnosis Code(s):    --- Professional ---                       K20.8, Other esophagitis                       K29.60, Other gastritis without bleeding                       K31.89, Other diseases of stomach and duodenum CPT copyright 2017 American Medical Association. All rights reserved. The codes documented in this report are preliminary and upon coder review may  be revised to meet current compliance requirements. Lollie Sails, MD 12/16/2017 8:31:19 AM This report has been signed electronically. Number of Addenda: 0 Note Initiated On: 12/16/2017 8:06 AM      [facility]

## 2017-12-18 LAB — SURGICAL PATHOLOGY

## 2018-01-08 DIAGNOSIS — Z862 Personal history of diseases of the blood and blood-forming organs and certain disorders involving the immune mechanism: Secondary | ICD-10-CM | POA: Insufficient documentation

## 2018-01-09 DIAGNOSIS — E538 Deficiency of other specified B group vitamins: Secondary | ICD-10-CM | POA: Insufficient documentation

## 2018-01-23 ENCOUNTER — Other Ambulatory Visit: Payer: Self-pay | Admitting: Gastroenterology

## 2018-01-23 DIAGNOSIS — R1032 Left lower quadrant pain: Secondary | ICD-10-CM

## 2018-01-27 ENCOUNTER — Other Ambulatory Visit
Admission: RE | Admit: 2018-01-27 | Discharge: 2018-01-27 | Disposition: A | Discharge status: Home / Self Care | Payer: Medicare Other | Source: Ambulatory Visit | Attending: Gastroenterology | Admitting: Gastroenterology

## 2018-01-27 DIAGNOSIS — R197 Diarrhea, unspecified: Secondary | ICD-10-CM | POA: Insufficient documentation

## 2018-01-27 LAB — GASTROINTESTINAL PANEL BY PCR, STOOL (REPLACES STOOL CULTURE)

## 2018-01-27 LAB — C DIFFICILE QUICK SCREEN W PCR REFLEX
C DIFFICILE (CDIFF) INTERP: NOT DETECTED
C DIFFICILE (CDIFF) TOXIN: NEGATIVE
C DIFFICLE (CDIFF) ANTIGEN: NEGATIVE

## 2018-01-29 ENCOUNTER — Ambulatory Visit
Admission: RE | Admit: 2018-01-29 | Discharge: 2018-01-29 | Disposition: A | Discharge status: Home / Self Care | Payer: Medicare Other | Source: Ambulatory Visit | Attending: Gastroenterology | Admitting: Gastroenterology

## 2018-01-29 DIAGNOSIS — N83202 Unspecified ovarian cyst, left side: Secondary | ICD-10-CM | POA: Insufficient documentation

## 2018-01-29 DIAGNOSIS — R1032 Left lower quadrant pain: Secondary | ICD-10-CM | POA: Diagnosis present

## 2018-01-29 LAB — PANCREATIC ELASTASE, FECAL: Pancreatic Elastase-1, Stool: 415 ug Elast./g (ref >200)

## 2018-01-29 MED ORDER — IOPAMIDOL (ISOVUE-300) INJECTION 61%
85.0000 mL | Freq: Once | INTRAVENOUS | Status: AC | PRN
  Administered 2018-01-29: 85 mL via INTRAVENOUS

## 2018-02-28 ENCOUNTER — Other Ambulatory Visit: Payer: Self-pay | Admitting: Gastroenterology

## 2018-02-28 DIAGNOSIS — E538 Deficiency of other specified B group vitamins: Secondary | ICD-10-CM

## 2018-02-28 DIAGNOSIS — R197 Diarrhea, unspecified: Secondary | ICD-10-CM

## 2018-03-03 ENCOUNTER — Encounter: Payer: Self-pay | Admitting: Podiatry

## 2018-03-03 ENCOUNTER — Ambulatory Visit: Payer: Medicare Other | Admitting: Podiatry

## 2018-03-03 ENCOUNTER — Ambulatory Visit (INDEPENDENT_AMBULATORY_CARE_PROVIDER_SITE_OTHER): Payer: Medicare Other

## 2018-03-03 DIAGNOSIS — L603 Nail dystrophy: Secondary | ICD-10-CM

## 2018-03-03 DIAGNOSIS — M722 Plantar fascial fibromatosis: Secondary | ICD-10-CM | POA: Diagnosis not present

## 2018-03-03 MED ORDER — MELOXICAM 15 MG PO TABS: 15.0000 mg | ORAL_TABLET | Freq: Every day | ORAL | 3 refills | Status: DC

## 2018-03-03 NOTE — Progress Notes (Signed)
Subjective:  Patient ID: Pamela Nash, female    DOB: 08/31/1950,  MRN: [id number] HPI Chief Complaint  Patient presents with  . Foot Pain    Patient presents today for left heel pain x 2 months.  She states "its a sharp pain that comes and goes and its pretty bad in the mornings and when i first stand up"  She has been taking Tylenol, Advil, heat, ice and soaking in Epson salt with temporary relief     She also c/o left 4th toenail fungus, thick and discolored x 1 year  . Nail Problem    67 y.o. female presents with the above complaint.   ROS: Denies fever chills nausea vomiting muscle aches pains calf pain back pain chest pain shortness of breath.  Past Medical History:  Diagnosis Date  . Arthritis   . Atrial flutter ([place])    c-pap dependent  . Breast screening, unspecified   . Complication of anesthesia   . CPAP (continuous positive airway pressure) dependence    FOR PAC'S  . Diabetes ([place])    non-insulin dependent  . Diffuse cystic mastopathy   . Dysrhythmia    PAC'S  . Edema    ANKLES  . Family history of malignant neoplasm of breast   . GERD (gastroesophageal reflux disease)   . High cholesterol   . Obesity, unspecified   . Personal history of colonic polyps   . PONV (postoperative nausea and vomiting)   . Sleep apnea   . Special screening for malignant neoplasms, colon   . Unspecified essential hypertension    Past Surgical History:  Procedure Laterality Date  . ABDOMINAL HYSTERECTOMY  1998  . BREAST BIOPSY Right 2016   W/CLIP - NEG  . BREAST EXCISIONAL BIOPSY Right 1991   NEG  . BREAST EXCISIONAL BIOPSY Right 1995   NEG  . CATARACT EXTRACTION W/PHACO Right 11/13/2016   Procedure: CATARACT EXTRACTION PHACO AND INTRAOCULAR LENS PLACEMENT (IOC);  Surgeon: Birder Robson, MD;  Location: [facility] ORS;  Service: Ophthalmology;  Laterality: Right;  [place] 00:41.8 AP% 24.3 CDE 10.13 Fluid pack lot # [id number]  . CATARACT EXTRACTION W/PHACO Left 12/04/2016   Procedure: CATARACT EXTRACTION PHACO AND INTRAOCULAR LENS PLACEMENT (IOC);  Surgeon: Birder Robson, MD;  Location: [facility] ORS;  Service: Ophthalmology;  Laterality: Left;  [place] 00:39 AP% 16.2 CDE 6.34 fluid pack lot # [id number]  . COLONOSCOPY  2010  . COLONOSCOPY     x3 last 01/23/2013  . COLONOSCOPY WITH PROPOFOL N/A 12/16/2017   Procedure: COLONOSCOPY WITH PROPOFOL;  Surgeon: Lollie Sails, MD;  [facility] ENDOSCOPY;  Service: Endoscopy;  Laterality: N/A;  . DILATION AND CURETTAGE OF UTERUS    . ESOPHAGOGASTRODUODENOSCOPY N/A 12/16/2017   Procedure: ESOPHAGOGASTRODUODENOSCOPY (EGD);  Surgeon: Lollie Sails, MD;  Location: [facility] ENDOSCOPY;  Service: Endoscopy;  Laterality: N/A;  . LAPAROSCOPY  1978  . PILONIDAL CYST / SINUS EXCISION  1978  . TUBAL LIGATION  1983    Current Outpatient Medications:  .  fluticasone (FLONASE) 50 MCG/ACT nasal spray, Place into the nose., Disp: , Rfl:  .  losartan (COZAAR) 50 MG tablet, TAKE 1 TABLET BY MOUTH DAILY ALONG WITH HCTZ 12.5, Disp: , Rfl:  .  pantoprazole (PROTONIX) 40 MG tablet, Take by mouth., Disp: , Rfl:  .  acetaminophen (TYLENOL) 500 MG tablet, Take 1,000 mg by mouth 2 (two) times daily as needed for moderate pain or headache., Disp: , Rfl:  .  alendronate (FOSAMAX) 70 MG tablet, Take 70  mg by mouth once a week. Take with a full glass of water on an empty stomach., Disp: , Rfl:  .  aspirin 81 MG tablet, Take 81 mg by mouth daily., Disp: , Rfl:  .  calcium carbonate (TUMS - DOSED IN MG ELEMENTAL CALCIUM) 500 MG chewable tablet, Chew 1 tablet by mouth daily as needed for indigestion or heartburn., Disp: , Rfl:  .  Cholecalciferol (VITAMIN D-3) 1000 UNITS CAPS, Take 1,000 Units by mouth daily. , Disp: , Rfl:  .  [employer] PURELAX powder, AS DIRECTED FOR COLONIC PREP., Disp: , Rfl: 0 .  dicyclomine (BENTYL) 10 MG capsule, Take 10 mg by mouth 4 (four) times daily as needed for spasms., Disp: , Rfl:  .  Difluprednate (DUREZOL) 0.05 % EMUL,  Place 1 drop into the right eye daily., Disp: , Rfl:  .  glimepiride (AMARYL) 2 MG tablet, Take 2 mg by mouth daily with lunch., Disp: , Rfl:  .  hydrochlorothiazide (HYDRODIURIL) 12.5 MG tablet, Take 12.5 mg by mouth daily. Along with losartan 50 mg, Disp: , Rfl:  .  hydrochlorothiazide (MICROZIDE) 12.5 MG capsule, Take 12.5 mg by mouth daily., Disp: , Rfl:  .  ibuprofen (ADVIL,MOTRIN) 200 MG tablet, Take 400 mg by mouth 2 (two) times daily as needed for headache or moderate pain., Disp: , Rfl:  .  losartan-hydrochlorothiazide (HYZAAR) 50-12.5 MG per tablet, Take 1 tablet by mouth daily. , Disp: , Rfl:  .  meloxicam (MOBIC) 15 MG tablet, Take 1 tablet (15 mg total) by mouth daily., Disp: 30 tablet, Rfl: 3 .  metFORMIN (GLUCOPHAGE-XR) 500 MG 24 hr tablet, Take 1,000 mg by mouth 2 (two) times daily., Disp: , Rfl:  .  metoprolol tartrate (LOPRESSOR) 25 MG tablet, Take 25 mg by mouth 2 (two) times daily. , Disp: , Rfl:  .  NEVANAC 0.1 % ophthalmic suspension, Place 1 drop into the right eye 2 (two) times daily., Disp: , Rfl:  .  ONE TOUCH ULTRA TEST test strip, , Disp: , Rfl:  .  ONETOUCH DELICA LANCETS 28B MISC, , Disp: , Rfl:  .  rosuvastatin (CRESTOR) 10 MG tablet, Take 10 mg by mouth every evening. , Disp: , Rfl:  .  vitamin B-12 (CYANOCOBALAMIN) 1000 MCG tablet, Take by mouth., Disp: , Rfl:  .  VIVELLE-DOT 0.1 MG/24HR, Place 1 patch onto the skin 2 (two) times a week. Wednesdays and Saturdays, Disp: , Rfl:   Allergies  Allergen Reactions  . Cefuroxime Axetil Swelling    Felt like throat was swelling; headaches  . Lisinopril     cough  . Losartan Potassium     Weight gain (hctz component negates the reaction)   . Pioglitazone Nausea And Vomiting and Rash    Med caused weight gain  . Sulfa Antibiotics Hives and Rash  . Sulfasalazine Hives and Rash   Review of Systems Objective:  There were no vitals filed for this visit.  General: Well developed, nourished, in no acute distress,  alert and oriented x3   Dermatological: Skin is warm, dry and supple bilateral. Nails x 10 are well maintained; remaining integument appears unremarkable at this time. There are no open sores, no preulcerative lesions, no rash or signs of infection present.  Nail plate fourth digit left foot thick yellow dystrophic possibly mycotic.  Vascular: Dorsalis Pedis artery and Posterior Tibial artery pedal pulses are 2/4 bilateral with immedate capillary fill time. Pedal hair growth present. No varicosities and no lower extremity edema present bilateral.  Neruologic: Grossly intact via light touch bilateral. Vibratory intact via tuning fork bilateral. Protective threshold with Semmes Wienstein monofilament intact to all pedal sites bilateral. Patellar and Achilles deep tendon reflexes 2+ bilateral. No Babinski or clonus noted bilateral.   Musculoskeletal: No gross boney pedal deformities bilateral. No pain, crepitus, or limitation noted with foot and ankle range of motion bilateral. Muscular strength 5/5 in all groups tested bilateral.  Pain on palpation medial calcaneal tubercle of the left heel.  No pain on medial and lateral compression of the calcaneus. Gait: Unassisted, Nonantalgic.    Radiographs:  Radiographs demonstrate soft tissue increase in density plantar calcaneal insertion site of the left heel.  Assessment & Plan:   Assessment: Discussed etiology pathology and surgical therapies at this point plantar fasciitis left and nail dystrophy fourth digit left.    Plan: After sterile Betadine skin prep injected 20 mg Kenalog 5 mg Marcaine point maximal tenderness of the left heel.  Start her on Mobic.  Placed in a plantar fascial brace discussed proper shoe gear stretching exercise ice therapy sugar modifications.  Took samples of the toenail be sent for pathologic evaluation I will follow-up with her in 1 month to discuss put most of these problems.     Shine Mikes T. [place], [place]

## 2018-03-05 ENCOUNTER — Encounter (HOSPITAL_COMMUNITY): Payer: Self-pay

## 2018-03-05 ENCOUNTER — Other Ambulatory Visit: Payer: Self-pay | Admitting: Gastroenterology

## 2018-03-05 ENCOUNTER — Ambulatory Visit
Admission: RE | Admit: 2018-03-05 | Discharge: 2018-03-05 | Disposition: A | Discharge status: Home / Self Care | Payer: Medicare Other | Source: Ambulatory Visit | Attending: Gastroenterology | Admitting: Gastroenterology

## 2018-03-05 ENCOUNTER — Ambulatory Visit (HOSPITAL_COMMUNITY): Admission: RE | Admit: 2018-03-05 | Payer: Medicare Other | Source: Ambulatory Visit

## 2018-03-05 DIAGNOSIS — R197 Diarrhea, unspecified: Secondary | ICD-10-CM | POA: Diagnosis present

## 2018-03-05 DIAGNOSIS — E538 Deficiency of other specified B group vitamins: Secondary | ICD-10-CM | POA: Diagnosis not present

## 2018-03-11 ENCOUNTER — Other Ambulatory Visit: Payer: Self-pay

## 2018-03-11 ENCOUNTER — Ambulatory Visit
Admission: EM | Admit: 2018-03-11 | Discharge: 2018-03-11 | Disposition: A | Discharge status: Home / Self Care | Payer: Medicare Other | Attending: Emergency Medicine | Admitting: Emergency Medicine

## 2018-03-11 DIAGNOSIS — J320 Chronic maxillary sinusitis: Secondary | ICD-10-CM | POA: Insufficient documentation

## 2018-03-11 DIAGNOSIS — M542 Cervicalgia: Secondary | ICD-10-CM | POA: Diagnosis not present

## 2018-03-11 DIAGNOSIS — H6983 Other specified disorders of Eustachian tube, bilateral: Secondary | ICD-10-CM | POA: Insufficient documentation

## 2018-03-11 MED ORDER — DOXYCYCLINE HYCLATE 100 MG PO CAPS: 100.0000 mg | ORAL_CAPSULE | Freq: Two times a day (BID) | ORAL | 0 refills | Status: DC

## 2018-03-11 NOTE — ED Triage Notes (Signed)
Patient complains of sinus pain and pressure that has been constant for 2 weeks. States that she took a zpak at the beginning and symptoms returned. States that she has so much head pressure she has been unable to sleep.

## 2018-03-11 NOTE — Discharge Instructions (Addendum)
Apply ice 20 minutes out of every 2 hours 4-5 times daily for comfort.  °

## 2018-03-11 NOTE — ED Provider Notes (Addendum)
[facility]    CSN: [id number] Arrival date & time: 03/11/18  4765     History   Chief Complaint Chief Complaint  Patient presents with  . Sinusitis    HPI Pamela Nash is a 67 y.o. female.   HPI  67 year old female presents with a sinus pain and pressure and constant for 2 weeks.  She states that she was placed on a Z-Pak by her primary care physician at the beginning of her illness which was described as a bad cold.  Is that she completed the Z-Pak but her symptoms have returned.  Dates that she has a lot of head pressure mostly over the maxillary sinuses her teeth hurt and she also has tenderness along her jaw.  In addition she is had neck muscle pain that will radiate into her trapezii.  Has had some mild dizziness when movement of her head.  She is complaining of no other neurological symptoms such as dysarthria weakness or numbness or tingling.  Her headache began shortly after she was switched to a different diabetes medicine fromMetformin to glimepiride        Past Medical History:  Diagnosis Date  . Arthritis   . Atrial flutter ([place])    c-pap dependent  . Breast screening, unspecified   . Complication of anesthesia   . CPAP (continuous positive airway pressure) dependence    FOR PAC'S  . Diabetes ([place])    non-insulin dependent  . Diffuse cystic mastopathy   . Dysrhythmia    PAC'S  . Edema    ANKLES  . Family history of malignant neoplasm of breast   . GERD (gastroesophageal reflux disease)   . High cholesterol   . Obesity, unspecified   . Personal history of colonic polyps   . PONV (postoperative nausea and vomiting)   . Sleep apnea   . Special screening for malignant neoplasms, colon   . Unspecified essential hypertension     Patient Active Problem List   Diagnosis Date Noted  . B12 deficiency 01/09/2018  . History of normocytic normochromic anemia 01/08/2018  . Osteopenia of multiple sites 07/16/2017  . Post menopausal  syndrome 06/24/2017  . Benign essential HTN 12/23/2013  . Diabetes ([place]) 09/18/2013  . HLD (hyperlipidemia) 09/18/2013  . BP (high blood pressure) 09/18/2013  . APC (atrial premature contractions) 09/18/2013  . Apnea, sleep 09/18/2013  . Family history of breast cancer 07/01/2013  . Fibrocystic breast disease 07/01/2012    Past Surgical History:  Procedure Laterality Date  . ABDOMINAL HYSTERECTOMY  1998  . BREAST BIOPSY Right 2016   W/CLIP - NEG  . BREAST EXCISIONAL BIOPSY Right 1991   NEG  . BREAST EXCISIONAL BIOPSY Right 1995   NEG  . CATARACT EXTRACTION W/PHACO Right 11/13/2016   Procedure: CATARACT EXTRACTION PHACO AND INTRAOCULAR LENS PLACEMENT (IOC);  Surgeon: Birder Robson, MD;  Location: [facility] ORS;  Service: Ophthalmology;  Laterality: Right;  [place] 00:41.8 AP% 24.3 CDE 10.13 Fluid pack lot # [id number]  . CATARACT EXTRACTION W/PHACO Left 12/04/2016   Procedure: CATARACT EXTRACTION PHACO AND INTRAOCULAR LENS PLACEMENT (IOC);  Surgeon: Birder Robson, MD;  Location: [facility] ORS;  Service: Ophthalmology;  Laterality: Left;  [place] 00:39 AP% 16.2 CDE 6.34 fluid pack lot # [id number]  . COLONOSCOPY  2010  . COLONOSCOPY     x3 last 01/23/2013  . COLONOSCOPY WITH PROPOFOL N/A 12/16/2017   Procedure: COLONOSCOPY WITH PROPOFOL;  Surgeon: Lollie Sails, MD;  Location: [facility] ENDOSCOPY;  Service: Endoscopy;  Laterality: N/A;  . DILATION AND CURETTAGE OF UTERUS    . ESOPHAGOGASTRODUODENOSCOPY N/A 12/16/2017   Procedure: ESOPHAGOGASTRODUODENOSCOPY (EGD);  Surgeon: Lollie Sails, MD;  Location: [facility] ENDOSCOPY;  Service: Endoscopy;  Laterality: N/A;  . LAPAROSCOPY  1978  . PILONIDAL CYST / SINUS EXCISION  1978  . TUBAL LIGATION  1983    OB History    Gravida  3   Para      Term      Preterm      AB      Living  2     SAB      TAB      Ectopic      Multiple      Live Births           Obstetric Comments  Age first menstrual cycle 45 Age first pregnancy 80          Home Medications    Prior to Admission medications   Medication Sig Start Date End Date Taking? Authorizing Provider  acetaminophen (TYLENOL) 500 MG tablet Take 1,000 mg by mouth 2 (two) times daily as needed for moderate pain or headache.   Yes [provider]  calcium carbonate (TUMS - DOSED IN MG ELEMENTAL CALCIUM) 500 MG chewable tablet Chew 1 tablet by mouth daily as needed for indigestion or heartburn.   Yes [provider]  Cholecalciferol (VITAMIN D-3) 1000 UNITS CAPS Take 1,000 Units by mouth daily.    Yes [provider]  dicyclomine (BENTYL) 10 MG capsule Take 10 mg by mouth 4 (four) times daily as needed for spasms.   Yes [provider]  fluticasone (FLONASE) 50 MCG/ACT nasal spray Place into the nose. 05/18/17  Yes [provider]  glimepiride (AMARYL) 2 MG tablet Take 2 mg by mouth 2 (two) times daily.    Yes [provider]  hydrochlorothiazide (HYDRODIURIL) 12.5 MG tablet Take 12.5 mg by mouth daily. Along with losartan 50 mg   Yes [provider]  ibuprofen (ADVIL,MOTRIN) 200 MG tablet Take 400 mg by mouth 2 (two) times daily as needed for headache or moderate pain.   Yes [provider]  losartan-hydrochlorothiazide (HYZAAR) 50-12.5 MG per tablet Take 1 tablet by mouth daily.  06/11/12  Yes [provider]  meloxicam (MOBIC) 15 MG tablet Take 1 tablet (15 mg total) by mouth daily. 03/03/18  Yes Hyatt, Max T, DPM  metoprolol tartrate (LOPRESSOR) 25 MG tablet Take 25 mg by mouth 2 (two) times daily.  06/11/12  Yes [provider]  ONE TOUCH ULTRA TEST test strip  06/11/12  Yes [provider]  ONETOUCH DELICA LANCETS 96V MISC  04/14/12  Yes [provider]  pantoprazole (PROTONIX) 40 MG tablet Take by mouth. 12/16/17 12/16/18 Yes [provider]  rosuvastatin (CRESTOR) 10 MG tablet Take 10 mg by mouth every evening.    Yes [provider]  vitamin B-12  (CYANOCOBALAMIN) 1000 MCG tablet Take by mouth.   Yes [provider]  VIVELLE-DOT 0.1 MG/24HR Place 1 patch onto the skin 2 (two) times a week. Wednesdays and Saturdays   Yes [provider]  doxycycline (VIBRAMYCIN) 100 MG capsule Take 1 capsule (100 mg total) by mouth 2 (two) times daily. 03/11/18   Lorin Picket, PA-C    Family History Family History  Problem Relation Age of Onset  . Breast cancer Maternal Grandmother     Social History Social History   Tobacco Use  . Smoking  status: Never Smoker  . Smokeless tobacco: Never Used  Substance Use Topics  . Alcohol use: No  . Drug use: No     Allergies   Cefuroxime axetil; Lisinopril; Losartan potassium; Pioglitazone; Sulfa antibiotics; and Sulfasalazine   Review of Systems Review of Systems  Constitutional: Positive for activity change and fatigue. Negative for appetite change, chills and fever.  HENT: Positive for congestion, sinus pressure and sinus pain.   Neurological: Positive for headaches.  All other systems reviewed and are negative.    Physical Exam Triage Vital Signs ED Triage Vitals  Enc Vitals Group     BP 03/11/18 0833 (!) 153/65     Pulse Rate 03/11/18 0833 61     Resp 03/11/18 0833 18     Temp 03/11/18 0833 98.6 F (37 C)     Temp Source 03/11/18 0833 Oral     SpO2 03/11/18 0833 99 %     Weight 03/11/18 0829 160 lb (72.6 kg)     Height 03/11/18 0829 5' 3.5" (1.613 m)     Head Circumference --      Peak Flow --      Pain Score 03/11/18 0829 8     Pain Loc --      Pain Edu? --      Excl. in [place]? --    No data found.  Updated Vital Signs BP (!) 153/65 (BP Location: Left Arm)   Pulse 61   Temp 98.6 F (37 C) (Oral)   Resp 18   Ht 5' 3.5" (1.613 m)   Wt 160 lb (72.6 kg)   SpO2 99%   BMI 27.90 kg/m   Visual Acuity Right Eye Distance:   Left Eye Distance:   Bilateral Distance:    Right Eye Near:   Left Eye Near:    Bilateral Near:     Physical Exam Vitals  signs and nursing note reviewed.  Constitutional:      General: She is not in acute distress.    Appearance: Normal appearance. She is normal weight. She is not ill-appearing, toxic-appearing or diaphoretic.  HENT:     Head: Normocephalic.     Comments: Has significant tenderness to percussion over the maxillary sinuses bilaterally.  There is also tenderness of the lower jaw.    Right Ear: Tympanic membrane and ear canal normal.     Left Ear: Tympanic membrane and ear canal normal.     Nose: Nose normal. No congestion or rhinorrhea.     Mouth/Throat:     Mouth: Mucous membranes are moist.     Pharynx: No oropharyngeal exudate or posterior oropharyngeal erythema.  Eyes:     General:        Right eye: No discharge.        Left eye: No discharge.     Conjunctiva/sclera: Conjunctivae normal.     Pupils: Pupils are equal, round, and reactive to light.  Neck:     Musculoskeletal: Neck supple. Muscular tenderness present.     Comments: Examination of the neck shows there to be supple.  Have decreased range of motion particularly with extension and with rotation.  There is a tenderness in the paraspinous muscles bilaterally.  This also extends into the trapezii bilaterally.  Trapezii are tight.  Her extremity strength and sensation are intact. Pulmonary:     Effort: Pulmonary effort is normal.     Breath sounds: Normal breath sounds.  Musculoskeletal: Normal range of motion.  Skin:    General:  Skin is warm and dry.  Neurological:     General: No focal deficit present.     Mental Status: She is alert and oriented to person, place, and time.  Psychiatric:        Mood and Affect: Mood normal.        Behavior: Behavior normal.        Thought Content: Thought content normal.        Judgment: Judgment normal.      UC Treatments / Results  Labs (all labs ordered are listed, but only abnormal results are displayed) Labs Reviewed - No data to display  EKG None  Radiology No results  found.  Procedures Procedures (including critical care time)  Medications Ordered in UC Medications - No data to display  Initial Impression / Assessment and Plan / UC Course  I have reviewed the triage vital signs and the nursing notes.  Pertinent labs & imaging results that were available during my care of the patient were reviewed by me and considered in my medical decision making (see chart for details).  It is suspicious that the patient developed headache after change of medications.  Does have findings of cervical spasm.  Advised her to use ice as much as possible on her neck.  She is not able to take anti-inflammatory drug and will use Tylenol.  Likely that the Z-Pak did not cover her sinusitis and I will switch her now to doxycycline.  She is allergic to cephalosporins and does not take Penicillin type medications.  If she is not improving she should follow-up with her primary care physician.   Final Clinical Impressions(s) / UC Diagnoses   Final diagnoses:  Chronic maxillary sinusitis  Musculoskeletal neck pain  Eustachian tube dysfunction, bilateral     Discharge Instructions     Apply ice 20 minutes out of every 2 hours 4-5 times daily for comfort.    ED Prescriptions    Medication Sig Dispense Auth. Provider   doxycycline (VIBRAMYCIN) 100 MG capsule Take 1 capsule (100 mg total) by mouth 2 (two) times daily. 14 capsule Lorin Picket, PA-C     Controlled Substance Prescriptions  Controlled Substance Registry consulted? Not Applicable   Lorin Picket, PA-C 03/11/18 0926    Lorin Picket, PA-C 03/11/18 [phone]

## 2018-03-18 ENCOUNTER — Encounter: Payer: Self-pay | Admitting: Emergency Medicine

## 2018-03-18 ENCOUNTER — Observation Stay
Admission: EM | Admit: 2018-03-18 | Discharge: 2018-03-19 | Disposition: A | Discharge status: Home / Self Care | Payer: Medicare Other | Attending: Family Medicine | Admitting: Family Medicine

## 2018-03-18 ENCOUNTER — Emergency Department: Payer: Medicare Other

## 2018-03-18 ENCOUNTER — Other Ambulatory Visit: Payer: Self-pay

## 2018-03-18 DIAGNOSIS — E119 Type 2 diabetes mellitus without complications: Secondary | ICD-10-CM | POA: Diagnosis not present

## 2018-03-18 DIAGNOSIS — G4733 Obstructive sleep apnea (adult) (pediatric): Secondary | ICD-10-CM | POA: Diagnosis not present

## 2018-03-18 DIAGNOSIS — Z7982 Long term (current) use of aspirin: Secondary | ICD-10-CM | POA: Insufficient documentation

## 2018-03-18 DIAGNOSIS — K219 Gastro-esophageal reflux disease without esophagitis: Secondary | ICD-10-CM | POA: Diagnosis not present

## 2018-03-18 DIAGNOSIS — R51 Headache: Secondary | ICD-10-CM | POA: Insufficient documentation

## 2018-03-18 DIAGNOSIS — G459 Transient cerebral ischemic attack, unspecified: Principal | ICD-10-CM | POA: Insufficient documentation

## 2018-03-18 DIAGNOSIS — Z79899 Other long term (current) drug therapy: Secondary | ICD-10-CM | POA: Insufficient documentation

## 2018-03-18 DIAGNOSIS — I10 Essential (primary) hypertension: Secondary | ICD-10-CM | POA: Insufficient documentation

## 2018-03-18 DIAGNOSIS — Z882 Allergy status to sulfonamides status: Secondary | ICD-10-CM | POA: Diagnosis not present

## 2018-03-18 DIAGNOSIS — E785 Hyperlipidemia, unspecified: Secondary | ICD-10-CM | POA: Diagnosis not present

## 2018-03-18 DIAGNOSIS — Z791 Long term (current) use of non-steroidal anti-inflammatories (NSAID): Secondary | ICD-10-CM | POA: Diagnosis not present

## 2018-03-18 DIAGNOSIS — E876 Hypokalemia: Secondary | ICD-10-CM | POA: Diagnosis not present

## 2018-03-18 DIAGNOSIS — E7800 Pure hypercholesterolemia, unspecified: Secondary | ICD-10-CM | POA: Diagnosis not present

## 2018-03-18 DIAGNOSIS — E669 Obesity, unspecified: Secondary | ICD-10-CM | POA: Diagnosis not present

## 2018-03-18 DIAGNOSIS — Z7984 Long term (current) use of oral hypoglycemic drugs: Secondary | ICD-10-CM | POA: Diagnosis not present

## 2018-03-18 DIAGNOSIS — Z888 Allergy status to other drugs, medicaments and biological substances status: Secondary | ICD-10-CM | POA: Diagnosis not present

## 2018-03-18 LAB — URINALYSIS, COMPLETE (UACMP) WITH MICROSCOPIC
Bilirubin Urine: NEGATIVE
GLUCOSE, UA: 50 mg/dL — AB
Hgb urine dipstick: NEGATIVE
KETONES UR: NEGATIVE mg/dL
Leukocytes, UA: NEGATIVE
NITRITE: NEGATIVE
Protein, ur: NEGATIVE mg/dL
Specific Gravity, Urine: 1.009 (ref 1.005–1.030)
pH: 6 (ref 5.0–8.0)

## 2018-03-18 LAB — CBC
HCT: 37.5 % (ref 36.0–46.0)
Hemoglobin: 12.2 g/dL (ref 12.0–15.0)
MCH: 28 pg (ref 26.0–34.0)
MCHC: 32.5 g/dL (ref 30.0–36.0)
MCV: 86 fL (ref 80.0–100.0)
Platelets: 322 10*3/uL (ref 150–400)
RBC: 4.36 MIL/uL (ref 3.87–5.11)
RDW: 11.9 % (ref 11.5–15.5)
WBC: 6.5 10*3/uL (ref 4.0–10.5)
nRBC: 0 % (ref 0.0–0.2)

## 2018-03-18 LAB — DIFFERENTIAL
Abs Immature Granulocytes: 0.01 10*3/uL (ref 0.00–0.07)
BASOS ABS: 0 10*3/uL (ref 0.0–0.1)
Basophils Relative: 1 %
EOS ABS: 0.1 10*3/uL (ref 0.0–0.5)
Eosinophils Relative: 2 %
Immature Granulocytes: 0 %
Lymphocytes Relative: 40 %
Lymphs Abs: 2.6 10*3/uL (ref 0.7–4.0)
Monocytes Absolute: 0.6 10*3/uL (ref 0.1–1.0)
Monocytes Relative: 9 %
Neutro Abs: 3.1 10*3/uL (ref 1.7–7.7)
Neutrophils Relative %: 48 %

## 2018-03-18 LAB — COMPREHENSIVE METABOLIC PANEL
ALT: 22 U/L (ref 0–44)
AST: 31 U/L (ref 15–41)
Albumin: 4 g/dL (ref 3.5–5.0)
Alkaline Phosphatase: 53 U/L (ref 38–126)
Anion gap: 12 (ref 5–15)
BUN: 18 mg/dL (ref 8–23)
CHLORIDE: 97 mmol/L — AB (ref 98–111)
CO2: 27 mmol/L (ref 22–32)
Calcium: 8.8 mg/dL — ABNORMAL LOW (ref 8.9–10.3)
Creatinine, Ser: 0.81 mg/dL (ref 0.44–1.00)
GFR calc Af Amer: >60 mL/min (ref >60)
GFR calc non Af Amer: >60 mL/min (ref >60)
Glucose, Bld: 178 mg/dL — ABNORMAL HIGH (ref 70–99)
POTASSIUM: 3.3 mmol/L — AB (ref 3.5–5.1)
Sodium: 136 mmol/L (ref 135–145)
Total Bilirubin: 0.9 mg/dL (ref 0.3–1.2)
Total Protein: 7.4 g/dL (ref 6.5–8.1)

## 2018-03-18 LAB — TROPONIN I: Troponin I: <0.03 ng/mL (ref <0.03)

## 2018-03-18 LAB — GLUCOSE, CAPILLARY: Glucose-Capillary: 156 mg/dL — ABNORMAL HIGH (ref 70–99)

## 2018-03-18 LAB — PROTIME-INR
INR: 1.09
Prothrombin Time: 14 seconds (ref 11.4–15.2)

## 2018-03-18 LAB — APTT: aPTT: 31 seconds (ref 24–36)

## 2018-03-18 LAB — ETHANOL: Alcohol, Ethyl (B): <10 mg/dL (ref <10)

## 2018-03-18 MED ORDER — ACETAMINOPHEN 325 MG PO TABS
650.0000 mg | ORAL_TABLET | Freq: Once | ORAL | Status: AC
  Administered 2018-03-18: 650 mg via ORAL
  Filled 2018-03-18: qty 2

## 2018-03-18 MED ORDER — IOPAMIDOL (ISOVUE-370) INJECTION 76%
75.0000 mL | Freq: Once | INTRAVENOUS | Status: AC | PRN
  Administered 2018-03-18: 75 mL via INTRAVENOUS

## 2018-03-18 MED ORDER — CLOPIDOGREL BISULFATE 75 MG PO TABS: 300.0000 mg | ORAL_TABLET | Freq: Once | ORAL | Status: DC

## 2018-03-18 MED ORDER — ASPIRIN 81 MG PO CHEW
324.0000 mg | CHEWABLE_TABLET | Freq: Once | ORAL | Status: AC
  Administered 2018-03-18: 324 mg via ORAL
  Filled 2018-03-18: qty 4

## 2018-03-18 NOTE — ED Notes (Signed)
Pt to ct 

## 2018-03-18 NOTE — ED Notes (Signed)
Patient went to restroom. EKG obtained once back in bed.

## 2018-03-18 NOTE — ED Notes (Signed)
Patient in restroom at this time.

## 2018-03-18 NOTE — ED Notes (Signed)
Patient passes swallow test.

## 2018-03-18 NOTE — H&P (Addendum)
Pamela Nash at [place] NAME: Pamela Nash    MR#:  [id number]  DATE OF BIRTH:  1951-01-19  DATE OF ADMISSION:  03/18/2018  PRIMARY CARE PHYSICIAN: Dion Body, MD   REQUESTING/REFERRING PHYSICIAN:   CHIEF COMPLAINT:   Chief Complaint  Patient presents with  . Headache    HISTORY OF PRESENT ILLNESS: Pamela Nash  is a 67 y.o. female with a known history per below presents emergency room with 3 to 4 weeks history of headache, developed acute right arm numbness, right-sided vision loss, slurred speech around 4:00 today, symptom lasted a few minutes then resolved, in the emergency room patient had extensive work-up that included CT head with angiogram and venogram which were negative for any acute process, potassium 3.3, patient evaluated in the emergency room, neurologically intact, patient is now been admitted for acute TIA versus possible migraine headache.  PAST MEDICAL HISTORY:   Past Medical History:  Diagnosis Date  . Arthritis   . Atrial flutter ([place])    c-pap dependent  . Breast screening, unspecified   . Complication of anesthesia   . CPAP (continuous positive airway pressure) dependence    FOR PAC'S  . Diabetes ([place])    non-insulin dependent  . Diffuse cystic mastopathy   . Dysrhythmia    PAC'S  . Edema    ANKLES  . Family history of malignant neoplasm of breast   . GERD (gastroesophageal reflux disease)   . High cholesterol   . Obesity, unspecified   . Personal history of colonic polyps   . PONV (postoperative nausea and vomiting)   . Sleep apnea   . Special screening for malignant neoplasms, colon   . Unspecified essential hypertension     PAST SURGICAL HISTORY:  Past Surgical History:  Procedure Laterality Date  . ABDOMINAL HYSTERECTOMY  1998  . BREAST BIOPSY Right 2016   W/CLIP - NEG  . BREAST EXCISIONAL BIOPSY Right 1991   NEG  . BREAST EXCISIONAL BIOPSY Right 1995   NEG  . CATARACT EXTRACTION  W/PHACO Right 11/13/2016   Procedure: CATARACT EXTRACTION PHACO AND INTRAOCULAR LENS PLACEMENT (IOC);  Surgeon: Birder Robson, MD;  Location: [facility] ORS;  Service: Ophthalmology;  Laterality: Right;  [place] 00:41.8 AP% 24.3 CDE 10.13 Fluid pack lot # [id number]  . CATARACT EXTRACTION W/PHACO Left 12/04/2016   Procedure: CATARACT EXTRACTION PHACO AND INTRAOCULAR LENS PLACEMENT (IOC);  Surgeon: Birder Robson, MD;  Location: [facility] ORS;  Service: Ophthalmology;  Laterality: Left;  [place] 00:39 AP% 16.2 CDE 6.34 fluid pack lot # [id number]  . COLONOSCOPY  2010  . COLONOSCOPY     x3 last 01/23/2013  . COLONOSCOPY WITH PROPOFOL N/A 12/16/2017   Procedure: COLONOSCOPY WITH PROPOFOL;  Surgeon: Lollie Sails, MD;  Location: [facility] ENDOSCOPY;  Service: Endoscopy;  Laterality: N/A;  . DILATION AND CURETTAGE OF UTERUS    . ESOPHAGOGASTRODUODENOSCOPY N/A 12/16/2017   Procedure: ESOPHAGOGASTRODUODENOSCOPY (EGD);  Surgeon: Lollie Sails, MD;  Location: [facility] ENDOSCOPY;  Service: Endoscopy;  Laterality: N/A;  . LAPAROSCOPY  1978  . PILONIDAL CYST / SINUS EXCISION  1978  . TUBAL LIGATION  1983    SOCIAL HISTORY:  Social History   Tobacco Use  . Smoking status: Never Smoker  . Smokeless tobacco: Never Used  Substance Use Topics  . Alcohol use: No    FAMILY HISTORY:  Family History  Problem Relation Age of Onset  . Breast cancer Maternal Grandmother     DRUG ALLERGIES:  Allergies  Allergen Reactions  . Cefuroxime Axetil Swelling    Felt like throat was swelling; headaches  . Lisinopril     cough  . Losartan Potassium     Weight gain (hctz component negates the reaction)   . Pioglitazone Nausea And Vomiting and Rash    Med caused weight gain  . Sulfa Antibiotics Hives and Rash  . Sulfasalazine Hives and Rash    REVIEW OF SYSTEMS:   CONSTITUTIONAL: No fever, fatigue or weakness.  EYES: No blurred or double vision. + Transient vision loss EARS, NOSE, AND THROAT: No tinnitus or ear  pain.  RESPIRATORY: No cough, shortness of breath, wheezing or hemoptysis.  CARDIOVASCULAR: No chest pain, orthopnea, edema.  GASTROINTESTINAL: No nausea, vomiting, diarrhea or abdominal pain.  GENITOURINARY: No dysuria, hematuria.  ENDOCRINE: No polyuria, nocturia,  HEMATOLOGY: No anemia, easy bruising or bleeding SKIN: No rash or lesion. MUSCULOSKELETAL: No joint pain or arthritis.   NEUROLOGIC: Right arm numbness, slurred speech  PSYCHIATRY: No anxiety or depression.   MEDICATIONS AT HOME:  Prior to Admission medications   Medication Sig Start Date End Date Taking? Authorizing Provider  acetaminophen (TYLENOL) 500 MG tablet Take 1,000 mg by mouth 2 (two) times daily as needed for moderate pain or headache.   Yes [provider]  Cholecalciferol (VITAMIN D-3) 1000 UNITS CAPS Take 1,000 Units by mouth daily.    Yes [provider]  dicyclomine (BENTYL) 10 MG capsule Take 10 mg by mouth 4 (four) times daily as needed for spasms.   Yes [provider]  doxycycline (VIBRAMYCIN) 100 MG capsule Take 1 capsule (100 mg total) by mouth 2 (two) times daily. 03/11/18  Yes Lorin Picket, PA-C  fluticasone (FLONASE) 50 MCG/ACT nasal spray Place into the nose. 05/18/17  Yes [provider]  glimepiride (AMARYL) 2 MG tablet Take 2 mg by mouth 2 (two) times daily.    Yes [provider]  hydrochlorothiazide (HYDRODIURIL) 12.5 MG tablet Take 12.5 mg by mouth daily. Along with losartan 50 mg   Yes [provider]  losartan-hydrochlorothiazide (HYZAAR) 50-12.5 MG per tablet Take 1 tablet by mouth daily.  06/11/12  Yes [provider]  meloxicam (MOBIC) 15 MG tablet Take 1 tablet (15 mg total) by mouth daily. 03/03/18  Yes Hyatt, Max T, DPM  metoprolol tartrate (LOPRESSOR) 25 MG tablet Take 25 mg by mouth 2 (two) times daily.  06/11/12  Yes [provider]  pantoprazole (PROTONIX) 40 MG tablet Take by mouth. 12/16/17 12/16/18 Yes [provider]  rosuvastatin (CRESTOR) 10 MG tablet Take 10 mg by mouth every evening.    Yes [provider]  vitamin B-12 (CYANOCOBALAMIN) 1000 MCG tablet Take by mouth.   Yes [provider]  VIVELLE-DOT 0.1 MG/24HR Place 1 patch onto the skin 2 (two) times a week. Wednesdays and Saturdays   Yes [provider]  calcium carbonate (TUMS - DOSED IN MG ELEMENTAL CALCIUM) 500 MG chewable tablet Chew 1 tablet by mouth daily as needed for indigestion or heartburn.    [provider]  ibuprofen (ADVIL,MOTRIN) 200 MG tablet Take 400 mg by mouth 2 (two) times daily as needed for headache or moderate pain.    [provider]  ONE TOUCH ULTRA TEST test strip  06/11/12   [provider]  Jonetta Speak LANCETS 16X [employer]  04/14/12   [provider]      PHYSICAL EXAMINATION:   VITAL SIGNS: Blood pressure (!) 152/66, pulse 61, temperature 97.6  F (36.4 C), temperature source Oral, resp. rate 16, height 5' 3.5" (1.613 m), weight 73.9 kg, SpO2 97 %.  GENERAL:  67 y.o.-year-old patient lying in the bed with no acute distress.  EYES: Pupils equal, round, reactive to light and accommodation. No scleral icterus. Extraocular muscles intact.  HEENT: Head atraumatic, normocephalic. Oropharynx and nasopharynx clear.  NECK:  Supple, no jugular venous distention. No thyroid enlargement, no tenderness.  LUNGS: Normal breath sounds bilaterally, no wheezing, rales,rhonchi or crepitation. No use of accessory muscles of respiration.  CARDIOVASCULAR: S1, S2 normal. No murmurs, rubs, or gallops.  ABDOMEN: Soft, nontender, nondistended. Bowel sounds present. No organomegaly or mass.  EXTREMITIES: No pedal edema, cyanosis, or clubbing.  NEUROLOGIC: Cranial nerves II through XII are intact. Muscle strength 5/5 in all extremities. Sensation intact. Gait not checked.  PSYCHIATRIC: The patient is alert and oriented x 3.  SKIN: No obvious rash, lesion, or ulcer.    LABORATORY PANEL:   CBC Recent Labs  Lab 03/18/18 1912  WBC 6.5  HGB 12.2  HCT 37.5  PLT 322  MCV 86.0  MCH 28.0  MCHC 32.5  RDW 11.9  LYMPHSABS 2.6  MONOABS 0.6  EOSABS 0.1  BASOSABS 0.0   ------------------------------------------------------------------------------------------------------------------  Chemistries  Recent Labs  Lab 03/18/18 1912  NA 136  K 3.3*  CL 97*  CO2 27  GLUCOSE 178*  BUN 18  CREATININE 0.81  CALCIUM 8.8*  AST 31  ALT 22  ALKPHOS 53  BILITOT 0.9   ------------------------------------------------------------------------------------------------------------------ estimated creatinine clearance is 66.5 mL/min (by C-G formula based on SCr of 0.81 mg/dL). ------------------------------------------------------------------------------------------------------------------ No results for input(s): TSH, T4TOTAL, T3FREE, THYROIDAB in the last 72 hours.  Invalid input(s): FREET3   Coagulation profile Recent Labs  Lab 03/18/18 1912  INR 1.09   ------------------------------------------------------------------------------------------------------------------- No results for input(s): DDIMER in the last 72 hours. -------------------------------------------------------------------------------------------------------------------  Cardiac Enzymes Recent Labs  Lab 03/18/18 1912  TROPONINI <0.03   ------------------------------------------------------------------------------------------------------------------ Invalid input(s): POCBNP  ---------------------------------------------------------------------------------------------------------------  Urinalysis    Component Value Date/Time   COLORURINE YELLOW (A) 03/18/2018 2028   APPEARANCEUR CLEAR (A) 03/18/2018 2028   LABSPEC 1.009 03/18/2018 2028   PHURINE 6.0 03/18/2018 2028   GLUCOSEU 50 (A) 03/18/2018 2028   HGBUR NEGATIVE 03/18/2018 2028   BILIRUBINUR NEGATIVE 03/18/2018 2028    KETONESUR NEGATIVE 03/18/2018 2028   PROTEINUR NEGATIVE 03/18/2018 2028   NITRITE NEGATIVE 03/18/2018 2028   LEUKOCYTESUR NEGATIVE 03/18/2018 2028     RADIOLOGY: Ct Angio Head W Or Wo Contrast  Result Date: 03/18/2018 CLINICAL DATA:  Initial evaluation for chronic headache. EXAM: CT ANGIOGRAPHY HEAD CT VENOGRAM HEAD TECHNIQUE: Multidetector CT imaging of the head was performed using the standard protocol during bolus administration of intravenous contrast. Multiplanar CT image reconstructions and MIPs were obtained to evaluate the vascular anatomy. CONTRAST:  36mL ISOVUE-370 IOPAMIDOL (ISOVUE-370) INJECTION 76% COMPARISON:  None. FINDINGS: CT HEAD Brain: Generalized age-related cerebral atrophy. Mild chronic microvascular ischemic disease seen involving the periventricular deep white matter both cerebral hemispheres. No acute intracranial hemorrhage. No acute large vessel territory infarct. No mass lesion, midline shift or mass effect. No hydrocephalus. No extra-axial fluid collection. Vascular: No hyperdense vessel. Scattered vascular calcifications noted within the carotid siphons. Skull: Scalp soft tissues and calvarium within normal limits. Sinuses: Visualized paranasal sinuses are clear. Trace bilateral mastoid effusions noted. Orbits: Visualized globes and orbital soft tissues unremarkable. CTA HEAD Anterior circulation: Visualized distal cervical segments of the internal carotid arteries are widely patent bilaterally. Petrous segments widely patent without  stenosis. Scattered atheromatous plaque within the cavernous/supraclinoid ICAs bilaterally, right slightly worse than left. Resultant stenosis of up to approximately 50-70% at the anterior genu of the cavernous right ICA. Associated more mild to moderate narrowing of approximately 30-50% at the cavernous left ICA. ICA termini well perfused. A1 segments patent bilaterally. Normal anterior communicating artery. Scattered atheromatous irregularity  within the anterior cerebral arteries without high-grade flow-limiting stenosis. M1 segments patent without stenosis. Normal MCA bifurcations. Distal MCA branches well perfused and symmetric. Posterior circulation: Visualized vertebral arteries patent to the vertebrobasilar junction without stenosis. Dominant right vertebral artery with hypoplastic left vertebral artery. Right PICA patent proximally. Left PICA not visualized. Basilar widely patent to its distal aspect without stenosis. Superior cerebral arteries patent bilaterally. Right PCA supplied via the basilar. Fetal type origin of the left PCA. PCAs patent to their distal aspects without stenosis. Venous sinuses: Dedicated CT venogram images were performed. Normal opacification of the superior sagittal sinus to the torcula. Transverse and sigmoid sinuses are patent bilaterally as are the visualized proximal internal jugular veins. Straight sinus, vein of Galen, internal cerebral veins, and basal veins of Rosenthal patent. No evidence for dural sinus thrombosis. Anatomic variants: Fetal type origin of the left PCA. No intracranial aneurysm or other vascular abnormality. Slight on CTA views, likely related to contrast phase. Of the posterior circulation Delayed phase: No pathologic enhancement. IMPRESSION: 1. Generalized age-related cerebral atrophy with mild chronic small vessel ischemic disease. No acute intracranial abnormality. 2. Negative CTA for large vessel occlusion or other acute vascular abnormality. No intracranial aneurysm. 3. Atheromatous plaque within the carotid siphons bilaterally with resultant mild to moderate multifocal narrowing as above, right greater than left. No other hemodynamically significant or correctable stenosis. 4. Negative CT venogram.  No evidence for dural sinus thrombosis. Electronically Signed   By: Jeannine Boga M.D.   On: 03/18/2018 22:29   Ct Venogram Head  Result Date: 03/18/2018 CLINICAL DATA:  Initial  evaluation for chronic headache. EXAM: CT ANGIOGRAPHY HEAD CT VENOGRAM HEAD TECHNIQUE: Multidetector CT imaging of the head was performed using the standard protocol during bolus administration of intravenous contrast. Multiplanar CT image reconstructions and MIPs were obtained to evaluate the vascular anatomy. CONTRAST:  62mL ISOVUE-370 IOPAMIDOL (ISOVUE-370) INJECTION 76% COMPARISON:  None. FINDINGS: CT HEAD Brain: Generalized age-related cerebral atrophy. Mild chronic microvascular ischemic disease seen involving the periventricular deep white matter both cerebral hemispheres. No acute intracranial hemorrhage. No acute large vessel territory infarct. No mass lesion, midline shift or mass effect. No hydrocephalus. No extra-axial fluid collection. Vascular: No hyperdense vessel. Scattered vascular calcifications noted within the carotid siphons. Skull: Scalp soft tissues and calvarium within normal limits. Sinuses: Visualized paranasal sinuses are clear. Trace bilateral mastoid effusions noted. Orbits: Visualized globes and orbital soft tissues unremarkable. CTA HEAD Anterior circulation: Visualized distal cervical segments of the internal carotid arteries are widely patent bilaterally. Petrous segments widely patent without stenosis. Scattered atheromatous plaque within the cavernous/supraclinoid ICAs bilaterally, right slightly worse than left. Resultant stenosis of up to approximately 50-70% at the anterior genu of the cavernous right ICA. Associated more mild to moderate narrowing of approximately 30-50% at the cavernous left ICA. ICA termini well perfused. A1 segments patent bilaterally. Normal anterior communicating artery. Scattered atheromatous irregularity within the anterior cerebral arteries without high-grade flow-limiting stenosis. M1 segments patent without stenosis. Normal MCA bifurcations. Distal MCA branches well perfused and symmetric. Posterior circulation: Visualized vertebral arteries patent to  the vertebrobasilar junction without stenosis. Dominant right vertebral artery with hypoplastic left  vertebral artery. Right PICA patent proximally. Left PICA not visualized. Basilar widely patent to its distal aspect without stenosis. Superior cerebral arteries patent bilaterally. Right PCA supplied via the basilar. Fetal type origin of the left PCA. PCAs patent to their distal aspects without stenosis. Venous sinuses: Dedicated CT venogram images were performed. Normal opacification of the superior sagittal sinus to the torcula. Transverse and sigmoid sinuses are patent bilaterally as are the visualized proximal internal jugular veins. Straight sinus, vein of Galen, internal cerebral veins, and basal veins of Rosenthal patent. No evidence for dural sinus thrombosis. Anatomic variants: Fetal type origin of the left PCA. No intracranial aneurysm or other vascular abnormality. Slight on CTA views, likely related to contrast phase. Of the posterior circulation Delayed phase: No pathologic enhancement. IMPRESSION: 1. Generalized age-related cerebral atrophy with mild chronic small vessel ischemic disease. No acute intracranial abnormality. 2. Negative CTA for large vessel occlusion or other acute vascular abnormality. No intracranial aneurysm. 3. Atheromatous plaque within the carotid siphons bilaterally with resultant mild to moderate multifocal narrowing as above, right greater than left. No other hemodynamically significant or correctable stenosis. 4. Negative CT venogram.  No evidence for dural sinus thrombosis. Electronically Signed   By: Jeannine Boga M.D.   On: 03/18/2018 22:29    EKG: Orders placed or performed during the hospital encounter of 03/18/18  . ED EKG  . ED EKG    IMPRESSION AND PLAN: *Acute/subacute headache, right arm numbness, slurred speech, vision loss Exact etiology unknown-TIA versus migraine headache Refer to the observation unit on our TIA/CVA protocol, neurology to see,  check MRI of the brain for further evaluation, echocardiogram, carotid Dopplers, aspirin, statin therapy, check lipids in the morning, neurochecks per routine, Topamax PRN headaches, continue close medical monitoring  *Chronic diabetes mellitus type 2 Continue home regiment, check hemoglobin A1c determine level control, sliding scale insulin with Accu-Cheks per routine  *Acute hypokalemia Replete with p.o. potassium, check magnesium level  *Chronic hyperlipidemia, unspecified Continue statin therapy, check lipids in the morning  *Chronic GERD without esophagitis PPI daily  *Chronic obesity Lifestyle modification recommended  *Chronic benign essential hypertension Stable Continue home regiment  *Chronic obstructive sleep apnea CPAP at at bedtime/as needed   All the records are reviewed and case discussed with ED provider. Management plans discussed with the patient, family and they are in agreement.  CODE STATUS:full    TOTAL TIME TAKING CARE OF THIS PATIENT: 35 minutes.    Avel Peace Khloie Hamada M.D on 03/18/2018   Between 7am to 6pm - Pager - [phone]  After 6pm go to [email or web address] - password [email or web address]  Office  [phone]  CC: Primary care physician; Dion Body, MD   Note: This dictation was prepared with Dragon dictation along with smaller phrase technology. Any transcriptional errors that result from this process are unintentional.

## 2018-03-18 NOTE — ED Triage Notes (Signed)
PT arrived with complaints of left sided headache that started 3 weeks prior.Pt reports the pain has been intermittent. PT states today at "3 or 4"  pt has a episode last "only a few minutes" where pt reports slurred speech and loss of vision on right eye. No neuro deficits in triage.

## 2018-03-18 NOTE — ED Notes (Signed)
Patient returned from [place]. Patient is smiling and talking with family.

## 2018-03-18 NOTE — ED Provider Notes (Signed)
[facility] Emergency Department Provider Note  ____________________________________________  Time seen: Approximately 11:17 PM  I have reviewed the triage vital signs and the nursing notes.   HISTORY  Chief Complaint Headache    HPI Pamela Nash is a 67 y.o. female with a history of atrial flutter, diabetes, hypertension, hyperlipidemia who comes to the ED  due to an episode of numbness of the right arm, right-sided vision loss, and difficulty speaking that happened suddenly at 4:00 PM today.  The symptoms lasted for just a few minutes and then resolved.  This is never happened before, and she denies any weakness.  No falls or head trauma.  No fevers or chills.    She does report that she has had a gradual onset constant left temporal headache for the past 3 weeks.  She has been seen by her doctor, had an ESR done yesterday which was normal.  Headache is improved by taking Tylenol, then recurs.  No aggravating factors, nonradiating.  Severe. She also reports that over the past 3 weeks she has been treated for maxillary sinusitis with azithromycin and doxycycline.  sHe does feel that her symptoms in that regard have improved.    Past Medical History:  Diagnosis Date  . Arthritis   . Atrial flutter ([place])    c-pap dependent  . Breast screening, unspecified   . Complication of anesthesia   . CPAP (continuous positive airway pressure) dependence    FOR PAC'S  . Diabetes ([place])    non-insulin dependent  . Diffuse cystic mastopathy   . Dysrhythmia    PAC'S  . Edema    ANKLES  . Family history of malignant neoplasm of breast   . GERD (gastroesophageal reflux disease)   . High cholesterol   . Obesity, unspecified   . Personal history of colonic polyps   . PONV (postoperative nausea and vomiting)   . Sleep apnea   . Special screening for malignant neoplasms, colon   . Unspecified essential hypertension      Patient Active Problem List    Diagnosis Date Noted  . B12 deficiency 01/09/2018  . History of normocytic normochromic anemia 01/08/2018  . Osteopenia of multiple sites 07/16/2017  . Post menopausal syndrome 06/24/2017  . Benign essential HTN 12/23/2013  . Diabetes ([place]) 09/18/2013  . HLD (hyperlipidemia) 09/18/2013  . BP (high blood pressure) 09/18/2013  . APC (atrial premature contractions) 09/18/2013  . Apnea, sleep 09/18/2013  . Family history of breast cancer 07/01/2013  . Fibrocystic breast disease 07/01/2012     Past Surgical History:  Procedure Laterality Date  . ABDOMINAL HYSTERECTOMY  1998  . BREAST BIOPSY Right 2016   W/CLIP - NEG  . BREAST EXCISIONAL BIOPSY Right 1991   NEG  . BREAST EXCISIONAL BIOPSY Right 1995   NEG  . CATARACT EXTRACTION W/PHACO Right 11/13/2016   Procedure: CATARACT EXTRACTION PHACO AND INTRAOCULAR LENS PLACEMENT (IOC);  Surgeon: Birder Robson, MD;  Location: [facility] ORS;  Service: Ophthalmology;  Laterality: Right;  [place] 00:41.8 AP% 24.3 CDE 10.13 Fluid pack lot # [id number]  . CATARACT EXTRACTION W/PHACO Left 12/04/2016   Procedure: CATARACT EXTRACTION PHACO AND INTRAOCULAR LENS PLACEMENT (IOC);  Surgeon: Birder Robson, MD;  Location: [facility] ORS;  Service: Ophthalmology;  Laterality: Left;  [place] 00:39 AP% 16.2 CDE 6.34 fluid pack lot # [id number]  . COLONOSCOPY  2010  . COLONOSCOPY     x3 last 01/23/2013  . COLONOSCOPY WITH PROPOFOL N/A 12/16/2017   Procedure: COLONOSCOPY WITH PROPOFOL;  Surgeon: Lollie Sails, MD;  Location: [facility] ENDOSCOPY;  Service: Endoscopy;  Laterality: N/A;  . DILATION AND CURETTAGE OF UTERUS    . ESOPHAGOGASTRODUODENOSCOPY N/A 12/16/2017   Procedure: ESOPHAGOGASTRODUODENOSCOPY (EGD);  Surgeon: Lollie Sails, MD;  Location: [facility] ENDOSCOPY;  Service: Endoscopy;  Laterality: N/A;  . LAPAROSCOPY  1978  . PILONIDAL CYST / SINUS EXCISION  1978  . TUBAL LIGATION  1983     Prior to Admission medications   Medication Sig Start Date End Date  Taking? Authorizing Provider  acetaminophen (TYLENOL) 500 MG tablet Take 1,000 mg by mouth 2 (two) times daily as needed for moderate pain or headache.   Yes [provider]  Cholecalciferol (VITAMIN D-3) 1000 UNITS CAPS Take 1,000 Units by mouth daily.    Yes [provider]  dicyclomine (BENTYL) 10 MG capsule Take 10 mg by mouth 4 (four) times daily as needed for spasms.   Yes [provider]  doxycycline (VIBRAMYCIN) 100 MG capsule Take 1 capsule (100 mg total) by mouth 2 (two) times daily. 03/11/18  Yes Lorin Picket, PA-C  fluticasone (FLONASE) 50 MCG/ACT nasal spray Place into the nose. 05/18/17  Yes [provider]  glimepiride (AMARYL) 2 MG tablet Take 2 mg by mouth 2 (two) times daily.    Yes [provider]  hydrochlorothiazide (HYDRODIURIL) 12.5 MG tablet Take 12.5 mg by mouth daily. Along with losartan 50 mg   Yes [provider]  losartan-hydrochlorothiazide (HYZAAR) 50-12.5 MG per tablet Take 1 tablet by mouth daily.  06/11/12  Yes [provider]  meloxicam (MOBIC) 15 MG tablet Take 1 tablet (15 mg total) by mouth daily. 03/03/18  Yes Hyatt, Max T, DPM  metoprolol tartrate (LOPRESSOR) 25 MG tablet Take 25 mg by mouth 2 (two) times daily.  06/11/12  Yes [provider]  pantoprazole (PROTONIX) 40 MG tablet Take by mouth. 12/16/17 12/16/18 Yes [provider]  rosuvastatin (CRESTOR) 10 MG tablet Take 10 mg by mouth every evening.    Yes [provider]  vitamin B-12 (CYANOCOBALAMIN) 1000 MCG tablet Take by mouth.   Yes [provider]  VIVELLE-DOT 0.1 MG/24HR Place 1 patch onto the skin 2 (two) times a week. Wednesdays and Saturdays   Yes [provider]  calcium carbonate (TUMS - DOSED IN MG ELEMENTAL CALCIUM) 500 MG chewable tablet Chew 1 tablet by mouth daily as needed for indigestion or heartburn.    [provider]  ibuprofen (ADVIL,MOTRIN) 200 MG tablet Take 400 mg by  mouth 2 (two) times daily as needed for headache or moderate pain.    [provider]  ONE TOUCH ULTRA TEST test strip  06/11/12   [provider]  ONETOUCH DELICA LANCETS 40C MISC  04/14/12   [provider]     Allergies Cefuroxime axetil; Lisinopril; Losartan potassium; Pioglitazone; Sulfa antibiotics; and Sulfasalazine   Family History  Problem Relation Age of Onset  . Breast cancer Maternal Grandmother     Social History Social History   Tobacco Use  . Smoking status: Never Smoker  . Smokeless tobacco: Never Used  Substance Use Topics  . Alcohol use: No  . Drug use: No    Review of Systems  Constitutional:   No fever or chills.  ENT:   No sore throat. No rhinorrhea. Cardiovascular:   No chest pain or syncope. Respiratory:   No dyspnea or cough. Gastrointestinal:   Negative for abdominal pain, vomiting and diarrhea.  Musculoskeletal:   Negative  for focal pain or swelling All other systems reviewed and are negative except as documented above in ROS and HPI.  ____________________________________________   PHYSICAL EXAM:  VITAL SIGNS: ED Triage Vitals  Enc Vitals Group     BP 03/18/18 1900 (!) 177/71     Pulse Rate 03/18/18 1900 72     Resp 03/18/18 1900 18     Temp 03/18/18 1900 97.6 F (36.4 C)     Temp Source 03/18/18 1900 Oral     SpO2 03/18/18 1900 99 %     Weight 03/18/18 1903 163 lb (73.9 kg)     Height 03/18/18 1903 5' 3.5" (1.613 m)     Head Circumference --      Peak Flow --      Pain Score 03/18/18 1903 8     Pain Loc --      Pain Edu? --      Excl. in [place]? --     Vital signs reviewed, nursing assessments reviewed.   Constitutional:   Alert and oriented. Non-toxic appearance. Eyes:   Conjunctivae are normal. EOMI. PERRL.  No nystagmus ENT      Head:   Normocephalic and atraumatic.      Nose:   No congestion/rhinnorhea.       Mouth/Throat:   MMM, no pharyngeal erythema. No peritonsillar mass.       Neck:   No  meningismus. Full ROM. Hematological/Lymphatic/Immunilogical:   No cervical lymphadenopathy. Cardiovascular:   RRR. Symmetric bilateral radial and DP pulses.  No murmurs. Cap refill less than 2 seconds. Respiratory:   Normal respiratory effort without tachypnea/retractions. Breath sounds are clear and equal bilaterally. No wheezes/rales/rhonchi. Gastrointestinal:   Soft and nontender. Non distended. There is no CVA tenderness.  No rebound, rigidity, or guarding.  Musculoskeletal:   Normal range of motion in all extremities. No joint effusions.  No lower extremity tenderness.  No edema. Neurologic:   Normal speech and language.  Normal word finding Cranial nerves III through XII intact Normal pronator drift, normal finger-to-nose, normal heel shin Motor grossly intact. Normal sensation No acute focal neurologic deficits are appreciated.  NIH stroke scale 0 skin:    Skin is warm, dry and intact. No rash noted.  No petechiae, purpura, or bullae.  ____________________________________________    LABS (pertinent positives/negatives) (all labs ordered are listed, but only abnormal results are displayed) Labs Reviewed  GLUCOSE, CAPILLARY - Abnormal; Notable for the following components:      Result Value   Glucose-Capillary 156 (*)    All other components within normal limits  COMPREHENSIVE METABOLIC PANEL - Abnormal; Notable for the following components:   Potassium 3.3 (*)    Chloride 97 (*)    Glucose, Bld 178 (*)    Calcium 8.8 (*)    All other components within normal limits  URINALYSIS, COMPLETE (UACMP) WITH MICROSCOPIC - Abnormal; Notable for the following components:   Color, Urine YELLOW (*)    APPearance CLEAR (*)    Glucose, UA 50 (*)    Bacteria, UA RARE (*)    All other components within normal limits  ETHANOL  PROTIME-INR  APTT  CBC  DIFFERENTIAL  TROPONIN I   ____________________________________________   EKG  Interpreted by me  Date: 03/18/2018  Rate: 72   Rhythm: normal sinus rhythm  QRS Axis: normal  Intervals: normal  ST/T Wave abnormalities: normal  Conduction Disutrbances: none  Narrative Interpretation: unremarkable      ____________________________________________    RADIOLOGY  Ct  Angio Head W Or Wo Contrast  Result Date: 03/18/2018 CLINICAL DATA:  Initial evaluation for chronic headache. EXAM: CT ANGIOGRAPHY HEAD CT VENOGRAM HEAD TECHNIQUE: Multidetector CT imaging of the head was performed using the standard protocol during bolus administration of intravenous contrast. Multiplanar CT image reconstructions and MIPs were obtained to evaluate the vascular anatomy. CONTRAST:  79m ISOVUE-370 IOPAMIDOL (ISOVUE-370) INJECTION 76% COMPARISON:  None. FINDINGS: CT HEAD Brain: Generalized age-related cerebral atrophy. Mild chronic microvascular ischemic disease seen involving the periventricular deep white matter both cerebral hemispheres. No acute intracranial hemorrhage. No acute large vessel territory infarct. No mass lesion, midline shift or mass effect. No hydrocephalus. No extra-axial fluid collection. Vascular: No hyperdense vessel. Scattered vascular calcifications noted within the carotid siphons. Skull: Scalp soft tissues and calvarium within normal limits. Sinuses: Visualized paranasal sinuses are clear. Trace bilateral mastoid effusions noted. Orbits: Visualized globes and orbital soft tissues unremarkable. CTA HEAD Anterior circulation: Visualized distal cervical segments of the internal carotid arteries are widely patent bilaterally. Petrous segments widely patent without stenosis. Scattered atheromatous plaque within the cavernous/supraclinoid ICAs bilaterally, right slightly worse than left. Resultant stenosis of up to approximately 50-70% at the anterior genu of the cavernous right ICA. Associated more mild to moderate narrowing of approximately 30-50% at the cavernous left ICA. ICA termini well perfused. A1 segments patent  bilaterally. Normal anterior communicating artery. Scattered atheromatous irregularity within the anterior cerebral arteries without high-grade flow-limiting stenosis. M1 segments patent without stenosis. Normal MCA bifurcations. Distal MCA branches well perfused and symmetric. Posterior circulation: Visualized vertebral arteries patent to the vertebrobasilar junction without stenosis. Dominant right vertebral artery with hypoplastic left vertebral artery. Right PICA patent proximally. Left PICA not visualized. Basilar widely patent to its distal aspect without stenosis. Superior cerebral arteries patent bilaterally. Right PCA supplied via the basilar. Fetal type origin of the left PCA. PCAs patent to their distal aspects without stenosis. Venous sinuses: Dedicated CT venogram images were performed. Normal opacification of the superior sagittal sinus to the torcula. Transverse and sigmoid sinuses are patent bilaterally as are the visualized proximal internal jugular veins. Straight sinus, vein of Galen, internal cerebral veins, and basal veins of Rosenthal patent. No evidence for dural sinus thrombosis. Anatomic variants: Fetal type origin of the left PCA. No intracranial aneurysm or other vascular abnormality. Slight on CTA views, likely related to contrast phase. Of the posterior circulation Delayed phase: No pathologic enhancement. IMPRESSION: 1. Generalized age-related cerebral atrophy with mild chronic small vessel ischemic disease. No acute intracranial abnormality. 2. Negative CTA for large vessel occlusion or other acute vascular abnormality. No intracranial aneurysm. 3. Atheromatous plaque within the carotid siphons bilaterally with resultant mild to moderate multifocal narrowing as above, right greater than left. No other hemodynamically significant or correctable stenosis. 4. Negative CT venogram.  No evidence for dural sinus thrombosis. Electronically Signed   By: BJeannine BogaM.D.   On:  03/18/2018 22:29   Ct Venogram Head  Result Date: 03/18/2018 CLINICAL DATA:  Initial evaluation for chronic headache. EXAM: CT ANGIOGRAPHY HEAD CT VENOGRAM HEAD TECHNIQUE: Multidetector CT imaging of the head was performed using the standard protocol during bolus administration of intravenous contrast. Multiplanar CT image reconstructions and MIPs were obtained to evaluate the vascular anatomy. CONTRAST:  760mISOVUE-370 IOPAMIDOL (ISOVUE-370) INJECTION 76% COMPARISON:  None. FINDINGS: CT HEAD Brain: Generalized age-related cerebral atrophy. Mild chronic microvascular ischemic disease seen involving the periventricular deep white matter both cerebral hemispheres. No acute intracranial hemorrhage. No acute large vessel territory infarct. No mass  lesion, midline shift or mass effect. No hydrocephalus. No extra-axial fluid collection. Vascular: No hyperdense vessel. Scattered vascular calcifications noted within the carotid siphons. Skull: Scalp soft tissues and calvarium within normal limits. Sinuses: Visualized paranasal sinuses are clear. Trace bilateral mastoid effusions noted. Orbits: Visualized globes and orbital soft tissues unremarkable. CTA HEAD Anterior circulation: Visualized distal cervical segments of the internal carotid arteries are widely patent bilaterally. Petrous segments widely patent without stenosis. Scattered atheromatous plaque within the cavernous/supraclinoid ICAs bilaterally, right slightly worse than left. Resultant stenosis of up to approximately 50-70% at the anterior genu of the cavernous right ICA. Associated more mild to moderate narrowing of approximately 30-50% at the cavernous left ICA. ICA termini well perfused. A1 segments patent bilaterally. Normal anterior communicating artery. Scattered atheromatous irregularity within the anterior cerebral arteries without high-grade flow-limiting stenosis. M1 segments patent without stenosis. Normal MCA bifurcations. Distal MCA branches  well perfused and symmetric. Posterior circulation: Visualized vertebral arteries patent to the vertebrobasilar junction without stenosis. Dominant right vertebral artery with hypoplastic left vertebral artery. Right PICA patent proximally. Left PICA not visualized. Basilar widely patent to its distal aspect without stenosis. Superior cerebral arteries patent bilaterally. Right PCA supplied via the basilar. Fetal type origin of the left PCA. PCAs patent to their distal aspects without stenosis. Venous sinuses: Dedicated CT venogram images were performed. Normal opacification of the superior sagittal sinus to the torcula. Transverse and sigmoid sinuses are patent bilaterally as are the visualized proximal internal jugular veins. Straight sinus, vein of Galen, internal cerebral veins, and basal veins of Rosenthal patent. No evidence for dural sinus thrombosis. Anatomic variants: Fetal type origin of the left PCA. No intracranial aneurysm or other vascular abnormality. Slight on CTA views, likely related to contrast phase. Of the posterior circulation Delayed phase: No pathologic enhancement. IMPRESSION: 1. Generalized age-related cerebral atrophy with mild chronic small vessel ischemic disease. No acute intracranial abnormality. 2. Negative CTA for large vessel occlusion or other acute vascular abnormality. No intracranial aneurysm. 3. Atheromatous plaque within the carotid siphons bilaterally with resultant mild to moderate multifocal narrowing as above, right greater than left. No other hemodynamically significant or correctable stenosis. 4. Negative CT venogram.  No evidence for dural sinus thrombosis. Electronically Signed   By: Jeannine Boga M.D.   On: 03/18/2018 22:29    ____________________________________________   PROCEDURES Procedures  ____________________________________________  DIFFERENTIAL DIAGNOSIS   Acute ischemic stroke, cerebral aneurysm, intracranial sinus thrombosis,  TIA  CLINICAL IMPRESSION / ASSESSMENT AND PLAN / ED COURSE  Pertinent labs & imaging results that were available during my care of the patient were reviewed by me and considered in my medical decision making (see chart for details).      Clinical Course as of Mar 18 2316  Tue Mar 18, 2018  1949 Patient presents with clear strokelike symptoms.  In the setting of her assoc. Headache x 2 weeks with sinusitis, this raises the possibility of an aneurysm or a sinus thrombosis causing her symptoms.  Until this is further delineated with CT imaging, I will go with permissive hypertension and not treat the blood pressure currently.  If there is an acute bleed then we will need to lower her blood pressure and avoid antiplatelet agents, so for now just Tylenol until CT is complete.  Labs ordered.  Overall patient will need to be hospitalized for further stroke work-up given her comorbidities and clear stroke symptoms today.  On assessment currently her stroke scale is 0, not a TPA candidate.   [  PS]    Clinical Course User Index [PS] Carrie Mew, MD    ----------------------------------------- 11:20 PM on 03/18/2018 -----------------------------------------  CT angiogram and venogram negative.  Headache is now 2/10 after Tylenol.  Blood pressure is improved with pain control.  Reviewed her sulfasalazine allergy, she states that she had been on aspirin for a long time up until a few months ago and has had no trouble tolerating aspirin.  We will give her 324 chewable aspirin currently.  No further pain medicines or antihypertensives at this time.  We will continue with plan to hospitalize.  ____________________________________________   FINAL CLINICAL IMPRESSION(S) / ED DIAGNOSES    Final diagnoses:  TIA (transient ischemic attack)     ED Discharge Orders    None      Portions of this note were generated with dragon dictation software. Dictation errors may occur despite best attempts  at proofreading.   Carrie Mew, MD 03/18/18 2328

## 2018-03-18 NOTE — ED Notes (Signed)
Admitting physician in with patient.  

## 2018-03-18 NOTE — ED Notes (Signed)
Pt to room 1 c/o headache x 3 weeks. Pt says today she took tizanidine at 1100 and began having symptoms at 1600. Pt rates pain at her left temple an 8. Pt says her pain stays at that level. Pt had an episode at 1600 where she had numbness to her arm and visual disturbance. Symptoms have resolved. Grips equal bilaterally and strength is good. No facial paralysis or droop. Sensation is equal on both sides. No palmar drift. Pt is maintaining her own secretions. Pt reports pain with eye movement.

## 2018-03-19 ENCOUNTER — Observation Stay (HOSPITAL_BASED_OUTPATIENT_CLINIC_OR_DEPARTMENT_OTHER)
Admit: 2018-03-19 | Discharge: 2018-03-19 | Disposition: A | Discharge status: Home / Self Care | Payer: Medicare Other | Attending: Family Medicine | Admitting: Family Medicine

## 2018-03-19 ENCOUNTER — Observation Stay: Payer: Medicare Other

## 2018-03-19 DIAGNOSIS — I370 Nonrheumatic pulmonary valve stenosis: Secondary | ICD-10-CM | POA: Diagnosis not present

## 2018-03-19 DIAGNOSIS — G459 Transient cerebral ischemic attack, unspecified: Secondary | ICD-10-CM

## 2018-03-19 LAB — LIPID PANEL
Cholesterol: 169 mg/dL (ref 0–200)
HDL: 48 mg/dL (ref >40)
LDL Cholesterol: 94 mg/dL (ref 0–99)
TRIGLYCERIDES: 133 mg/dL (ref <150)
Total CHOL/HDL Ratio: 3.5 RATIO
VLDL: 27 mg/dL (ref 0–40)

## 2018-03-19 LAB — GLUCOSE, CAPILLARY
Glucose-Capillary: 134 mg/dL — ABNORMAL HIGH (ref 70–99)
Glucose-Capillary: 160 mg/dL — ABNORMAL HIGH (ref 70–99)
Glucose-Capillary: 145 mg/dL — ABNORMAL HIGH (ref 70–99)
Glucose-Capillary: 91 mg/dL (ref 70–99)

## 2018-03-19 LAB — MAGNESIUM: Magnesium: 1.9 mg/dL (ref 1.7–2.4)

## 2018-03-19 LAB — ECHOCARDIOGRAM COMPLETE
Height: 63 in
Weight: 2577.6 oz

## 2018-03-19 MED ORDER — ACETAMINOPHEN 160 MG/5ML PO SOLN: 650.0000 mg | ORAL | Status: DC | PRN

## 2018-03-19 MED ORDER — TOPIRAMATE 25 MG PO TABS: 25.0000 mg | ORAL_TABLET | Freq: Two times a day (BID) | ORAL | 0 refills | Status: DC | PRN

## 2018-03-19 MED ORDER — POTASSIUM CHLORIDE 20 MEQ PO PACK
40.0000 meq | PACK | Freq: Once | ORAL | Status: AC
  Administered 2018-03-19: 40 meq via ORAL
  Filled 2018-03-19: qty 2

## 2018-03-19 MED ORDER — STROKE: EARLY STAGES OF RECOVERY BOOK
Freq: Once | Status: AC
  Administered 2018-03-19: 01:00:00

## 2018-03-19 MED ORDER — MAGNESIUM OXIDE 400 MG PO CAPS: ORAL_CAPSULE | ORAL | 0 refills | Status: AC

## 2018-03-19 MED ORDER — ASPIRIN EC 81 MG PO TBEC: 81.0000 mg | DELAYED_RELEASE_TABLET | Freq: Every day | ORAL | 0 refills | Status: AC

## 2018-03-19 MED ORDER — GLIMEPIRIDE 2 MG PO TABS
2.0000 mg | ORAL_TABLET | Freq: Two times a day (BID) | ORAL | Status: DC
  Administered 2018-03-19: 2 mg via ORAL
  Filled 2018-03-19 (×3): qty 1

## 2018-03-19 MED ORDER — FLUTICASONE PROPIONATE 50 MCG/ACT NA SUSP
1.0000 | Freq: Every day | NASAL | Status: DC
  Filled 2018-03-19: qty 16

## 2018-03-19 MED ORDER — ACETAMINOPHEN 500 MG PO TABS
1000.0000 mg | ORAL_TABLET | Freq: Two times a day (BID) | ORAL | Status: DC | PRN
  Filled 2018-03-19 (×2): qty 2

## 2018-03-19 MED ORDER — MELOXICAM 7.5 MG PO TABS
15.0000 mg | ORAL_TABLET | Freq: Every day | ORAL | Status: DC
  Administered 2018-03-19: 10:00:00 15 mg via ORAL
  Filled 2018-03-19: qty 2

## 2018-03-19 MED ORDER — CALCIUM CARBONATE ANTACID 500 MG PO CHEW: 1.0000 | CHEWABLE_TABLET | Freq: Every day | ORAL | Status: DC | PRN

## 2018-03-19 MED ORDER — INSULIN ASPART 100 UNIT/ML ~~LOC~~ SOLN
0.0000 [IU] | Freq: Three times a day (TID) | SUBCUTANEOUS | Status: DC
  Administered 2018-03-19: 3 [IU] via SUBCUTANEOUS
  Filled 2018-03-19: qty 1

## 2018-03-19 MED ORDER — ASPIRIN 300 MG RE SUPP: 300.0000 mg | Freq: Every day | RECTAL | Status: DC

## 2018-03-19 MED ORDER — VITAMIN B-12 1000 MCG PO TABS
1000.0000 ug | ORAL_TABLET | Freq: Every day | ORAL | Status: DC
  Administered 2018-03-19: 1000 ug via ORAL
  Filled 2018-03-19: qty 1

## 2018-03-19 MED ORDER — ACETAMINOPHEN 650 MG RE SUPP: 650.0000 mg | RECTAL | Status: DC | PRN

## 2018-03-19 MED ORDER — IBUPROFEN 400 MG PO TABS: 400.0000 mg | ORAL_TABLET | Freq: Two times a day (BID) | ORAL | Status: DC | PRN

## 2018-03-19 MED ORDER — TOPIRAMATE 25 MG PO TABS
50.0000 mg | ORAL_TABLET | Freq: Two times a day (BID) | ORAL | Status: DC | PRN
  Filled 2018-03-19: qty 2

## 2018-03-19 MED ORDER — ROSUVASTATIN CALCIUM 10 MG PO TABS: 10.0000 mg | ORAL_TABLET | Freq: Every evening | ORAL | Status: DC

## 2018-03-19 MED ORDER — HYDROCHLOROTHIAZIDE 12.5 MG PO CAPS
12.5000 mg | ORAL_CAPSULE | Freq: Every day | ORAL | Status: DC
  Administered 2018-03-19: 10:00:00 12.5 mg via ORAL
  Filled 2018-03-19: qty 1

## 2018-03-19 MED ORDER — DICYCLOMINE HCL 10 MG PO CAPS
10.0000 mg | ORAL_CAPSULE | Freq: Four times a day (QID) | ORAL | Status: DC | PRN
  Filled 2018-03-19: qty 1

## 2018-03-19 MED ORDER — VITAMIN D 25 MCG (1000 UNIT) PO TABS
1000.0000 [IU] | ORAL_TABLET | Freq: Every day | ORAL | Status: DC
  Administered 2018-03-19: 10:00:00 1000 [IU] via ORAL
  Filled 2018-03-19: qty 1

## 2018-03-19 MED ORDER — SENNOSIDES-DOCUSATE SODIUM 8.6-50 MG PO TABS: 1.0000 | ORAL_TABLET | Freq: Every evening | ORAL | Status: DC | PRN

## 2018-03-19 MED ORDER — INSULIN ASPART 100 UNIT/ML ~~LOC~~ SOLN: 0.0000 [IU] | Freq: Every day | SUBCUTANEOUS | Status: DC

## 2018-03-19 MED ORDER — LOSARTAN POTASSIUM 50 MG PO TABS
50.0000 mg | ORAL_TABLET | Freq: Every day | ORAL | Status: DC
  Administered 2018-03-19: 50 mg via ORAL
  Filled 2018-03-19: qty 1

## 2018-03-19 MED ORDER — ENOXAPARIN SODIUM 40 MG/0.4ML ~~LOC~~ SOLN: 40.0000 mg | SUBCUTANEOUS | Status: DC

## 2018-03-19 MED ORDER — METOPROLOL TARTRATE 25 MG PO TABS
25.0000 mg | ORAL_TABLET | Freq: Two times a day (BID) | ORAL | Status: DC
  Administered 2018-03-19 (×2): 25 mg via ORAL
  Filled 2018-03-19 (×2): qty 1

## 2018-03-19 MED ORDER — VITAMIN B-12 1000 MCG PO TABS: 1000.0000 ug | ORAL_TABLET | Freq: Every day | ORAL | 0 refills | Status: AC

## 2018-03-19 MED ORDER — ASPIRIN 325 MG PO TABS: 325.0000 mg | ORAL_TABLET | Freq: Every day | ORAL | Status: DC

## 2018-03-19 MED ORDER — PANTOPRAZOLE SODIUM 40 MG PO TBEC
40.0000 mg | DELAYED_RELEASE_TABLET | Freq: Every day | ORAL | Status: DC
  Administered 2018-03-19: 10:00:00 40 mg via ORAL
  Filled 2018-03-19: qty 1

## 2018-03-19 MED ORDER — ACETAMINOPHEN 325 MG PO TABS
650.0000 mg | ORAL_TABLET | ORAL | Status: DC | PRN
  Administered 2018-03-19 (×2): 650 mg via ORAL

## 2018-03-19 MED ORDER — LOSARTAN POTASSIUM-HCTZ 50-12.5 MG PO TABS: 1.0000 | ORAL_TABLET | Freq: Every day | ORAL | Status: DC

## 2018-03-19 NOTE — Care Management Note (Signed)
Case Management Note  Patient Details  Name: Reham Slabaugh MRN: [id number] Date of Birth: [birth year]  Subjective/Objective:                  RNCM met with patient husband Fritz Pickerel and her daughter and her son in law as patient was out of the room for testing.  She is independent from home. She has access to DME through their church if needed.  Her PCP is Dr. Netty Starring.  MOON delivered/explained to [place].  Action/Plan:   Home health list provided for review. No anticipated needs.   Expected Discharge Date:                  Expected Discharge Plan:     In-House Referral:     Discharge planning Services  CM Consult  Post Acute Care Choice:  Home Health, Durable Medical Equipment Choice offered to:  Spouse  DME Arranged:    DME Agency:     HH Arranged:    [place] Agency:     Status of Service:  In process, will continue to follow  If discussed at Long Length of Stay Meetings, dates discussed:    Additional Comments:  Marshell Garfinkel, RN 03/19/2018, 9:31 AM

## 2018-03-19 NOTE — Consult Note (Signed)
Reason for Consult: R sided weakness  Referring Physician: Dr. Jerelyn Charles   CC: R sided weakness   HPI: Pamela Nash is an 67 y.o. female with a known history per below presents emergency room with 3 to 4 weeks history of headache, developed acute right arm numbness, right-sided vision loss, slurred speech around 4:00pm yesterday that lasted about 5-10 minutes.   Currently back to baseline.  Pt is no on ASA at home due to hx of ulcers.   Past Medical History:  Diagnosis Date  . Arthritis   . Atrial flutter ([place])    c-pap dependent  . Breast screening, unspecified   . Complication of anesthesia   . CPAP (continuous positive airway pressure) dependence    FOR PAC'S  . 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 Diffuse cystic mastopathy   . Dysrhythmia    PAC'S  . Edema    ANKLES  . Family history of malignant neoplasm of breast   . GERD (gastroesophageal reflux disease)   . High cholesterol   . Obesity, unspecified   . Personal history of colonic polyps   . PONV (postoperative nausea and vomiting)   . Sleep apnea   . Special screening for malignant neoplasms, colon   . Unspecified essential hypertension     Past Surgical History:  Procedure Laterality Date  . ABDOMINAL HYSTERECTOMY  1998  . BREAST BIOPSY Right 2016   W/CLIP - NEG  . BREAST EXCISIONAL BIOPSY Right 1991   NEG  . BREAST EXCISIONAL BIOPSY Right 1995   NEG  . CATARACT EXTRACTION W/PHACO Right 11/13/2016   Procedure: CATARACT EXTRACTION PHACO AND INTRAOCULAR LENS PLACEMENT (IOC);  Surgeon: Birder Robson, MD;  Location: [facility] ORS;  Service: Ophthalmology;  Laterality: Right;  [place] 00:41.8 AP% 24.3 CDE 10.13 Fluid pack lot # [id number]  . CATARACT EXTRACTION W/PHACO Left 12/04/2016   Procedure: CATARACT EXTRACTION PHACO AND INTRAOCULAR LENS PLACEMENT (IOC);  Surgeon: Birder Robson, MD;  Location: [facility] ORS;  Service: Ophthalmology;  Laterality: Left;  [place] 00:39 AP% 16.2 CDE 6.34 fluid pack lot # [id number]   . COLONOSCOPY  2010  . COLONOSCOPY     x3 last 01/23/2013  . COLONOSCOPY WITH PROPOFOL N/A 12/16/2017   Procedure: COLONOSCOPY WITH PROPOFOL;  Surgeon: Lollie Sails, MD;  Location[facility];  Service: Endoscopy;  Laterality: N/A;  . DILATION AND CURETTAGE OF UTERUS    . ESOPHAGOGASTRODUODENOSCOPY N/A 12/16/2017   Procedure: ESOPHAGOGASTRODUODENOSCOPY (EGD);  Surgeon: Lollie Sails, MD;  Location[facility] ENDOSCOPY;  Service: Endoscopy;  Laterality: N/A;  . LAPAROSCOPY  1978  . PILONIDAL CYST / SINUS EXCISION  1978  . TUBAL LIGATION  1983    Family History  Problem Relation Age of Onset  . Breast cancer Maternal Grandmother     Social History:  reports that she has never smoked. She has never used smokeless tobacco. She reports that she does not drink alcohol or use drugs.  Allergies  Allergen Reactions  . Cefuroxime Axetil Swelling    Felt like throat was swelling; headaches  . Lisinopril     cough  . Losartan Potassium     Weight gain (hctz component negates the reaction)   . Pioglitazone Nausea And Vomiting and Rash    Med caused weight gain  . Sulfa Antibiotics Hives and Rash  . Sulfasalazine Hives and Rash    Medications: I have reviewed the patient's current medications.  ROS: History obtained from the patient  General ROS: negative for -  chills, fatigue, fever, night sweats, weight gain or weight loss Psychological ROS: negative for - behavioral disorder, hallucinations, memory difficulties, mood swings or suicidal ideation Ophthalmic ROS: negative for - blurry vision, double vision, eye pain or loss of vision ENT ROS: negative for - epistaxis, nasal discharge, oral lesions, sore throat, tinnitus or vertigo Allergy and Immunology ROS: negative for - hives or itchy/watery eyes Hematological and Lymphatic ROS: negative for - bleeding problems, bruising or swollen lymph nodes Endocrine ROS: negative for - galactorrhea, hair pattern changes,  polydipsia/polyuria or temperature intolerance Respiratory ROS: negative for - cough, hemoptysis, shortness of breath or wheezing Cardiovascular ROS: negative for - chest pain, dyspnea on exertion, edema or irregular heartbeat Gastrointestinal ROS: negative for - abdominal pain, diarrhea, hematemesis, nausea/vomiting or stool incontinence Genito-Urinary ROS: negative for - dysuria, hematuria, incontinence or urinary frequency/urgency Musculoskeletal ROS: negative for - joint swelling or muscular weakness Neurological ROS: as noted in HPI Dermatological ROS: negative for rash and skin lesion changes  Physical Examination: Blood pressure (!) 154/75, pulse (!) 59, temperature 98.7 F (37.1 C), temperature source Oral, resp. rate 14, height 5\' 3"  (1.6 m), weight 73.1 kg, SpO2 97 %.   Neurological Examination   Mental Status: Alert, oriented, thought content appropriate.  Speech fluent without evidence of aphasia.  Able to follow 3 step commands without difficulty. Cranial Nerves: II: Discs flat bilaterally; Visual fields grossly normal, pupils equal, round, reactive to light and accommodation III,IV, VI: ptosis not present, extra-ocular motions intact bilaterally V,VII: smile symmetric, facial light touch sensation normal bilaterally VIII: hearing normal bilaterally IX,X: gag reflex present XI: bilateral shoulder shrug XII: midline tongue extension Motor: Right : Upper extremity   5/5    Left:     Upper extremity   5/5  Lower extremity   5/5     Lower extremity   5/5 Tone and bulk:normal tone throughout; no atrophy noted Sensory: Pinprick and light touch intact throughout, bilaterally Deep Tendon Reflexes: 2+ and symmetric throughout Plantars: Right: downgoing   Left: downgoing Cerebellar: normal finger-to-nose, normal rapid alternating movements and normal heel-to-shin test Gait: normal gait and station      Laboratory Studies:   Basic Metabolic Panel: Recent Labs  Lab  03/18/18 1912 03/19/18 0423  NA 136  --   K 3.3*  --   CL 97*  --   CO2 27  --   GLUCOSE 178*  --   BUN 18  --   CREATININE 0.81  --   CALCIUM 8.8*  --   MG  --  1.9    Liver Function Tests: Recent Labs  Lab 03/18/18 1912  AST 31  ALT 22  ALKPHOS 53  BILITOT 0.9  PROT 7.4  ALBUMIN 4.0   No results for input(s): LIPASE, AMYLASE in the last 168 hours. No results for input(s): AMMONIA in the last 168 hours.  CBC: Recent Labs  Lab 03/18/18 1912  WBC 6.5  NEUTROABS 3.1  HGB 12.2  HCT 37.5  MCV 86.0  PLT 322    Cardiac Enzymes: Recent Labs  Lab 03/18/18 1912  TROPONINI <0.03    BNP: Invalid input(s): POCBNP  CBG: Recent Labs  Lab 03/18/18 1900 03/19/18 0127 03/19/18 0737 03/19/18 0908  GLUCAP 156* 134* 160* 145*    Microbiology: Results for orders placed or performed during the hospital encounter of 01/27/18  C difficile quick scan w PCR reflex     Status: None   Collection Time: 01/27/18  7:50 AM  Result Value  Ref Range Status   C Diff antigen NEGATIVE NEGATIVE Final   C Diff toxin NEGATIVE NEGATIVE Final   C Diff interpretation No C. difficile detected.  Final    Comment: Performed at [facility], [address] [place], [place] [address]  Gastrointestinal Panel by PCR , Stool     Status: None   Collection Time: 01/27/18  7:50 AM  Result Value Ref Range Status   Campylobacter species NOT DETECTED NOT DETECTED Final   Plesimonas shigelloides NOT DETECTED NOT DETECTED Final   Salmonella species NOT DETECTED NOT DETECTED Final   Yersinia enterocolitica NOT DETECTED NOT DETECTED Final   Vibrio species NOT DETECTED NOT DETECTED Final   Vibrio cholerae NOT DETECTED NOT DETECTED Final   Enteroaggregative E coli (EAEC) NOT DETECTED NOT DETECTED Final   Enteropathogenic E coli (EPEC) NOT DETECTED NOT DETECTED Final   Enterotoxigenic E coli (ETEC) NOT DETECTED NOT DETECTED Final   Shiga like toxin producing E coli (STEC) NOT DETECTED NOT  DETECTED Final   Shigella/Enteroinvasive E coli (EIEC) NOT DETECTED NOT DETECTED Final   Cryptosporidium NOT DETECTED NOT DETECTED Final   Cyclospora cayetanensis NOT DETECTED NOT DETECTED Final   Entamoeba histolytica NOT DETECTED NOT DETECTED Final   Giardia lamblia NOT DETECTED NOT DETECTED Final   Adenovirus F40/41 NOT DETECTED NOT DETECTED Final   Astrovirus NOT DETECTED NOT DETECTED Final   Norovirus GI/GII NOT DETECTED NOT DETECTED Final   Rotavirus A NOT DETECTED NOT DETECTED Final   Sapovirus (I, II, IV, and V) NOT DETECTED NOT DETECTED Final    Comment: Performed at [facility], [address] [place], [place] [address]    Coagulation Studies: Recent Labs    03/18/18 1912  LABPROT 14.0  INR 1.09    Urinalysis:  Recent Labs  Lab 03/18/18 2028  COLORURINE YELLOW*  LABSPEC 1.009  PHURINE 6.0  GLUCOSEU 50*  HGBUR NEGATIVE  BILIRUBINUR NEGATIVE  KETONESUR NEGATIVE  PROTEINUR NEGATIVE  NITRITE NEGATIVE  LEUKOCYTESUR NEGATIVE    Lipid Panel:     Component Value Date/Time   CHOL 169 03/19/2018 0423   TRIG 133 03/19/2018 0423   HDL 48 03/19/2018 0423   CHOLHDL 3.5 03/19/2018 0423   VLDL 27 03/19/2018 0423   LDLCALC 94 03/19/2018 0423    HgbA1C: No results found for: HGBA1C  Urine Drug Screen:  No results found for: LABOPIA, COCAINSCRNUR, LABBENZ, AMPHETMU, THCU, LABBARB  Alcohol Level:  Recent Labs  Lab 03/18/18 1912  ETH <10    Other results: EKG: normal EKG, normal sinus rhythm, unchanged from previous tracings.  Imaging: Ct Angio Head W Or Wo Contrast  Result Date: 03/18/2018 CLINICAL DATA:  Initial evaluation for chronic headache. EXAM: CT ANGIOGRAPHY HEAD CT VENOGRAM HEAD TECHNIQUE: Multidetector CT imaging of the head was performed using the standard protocol during bolus administration of intravenous contrast. Multiplanar CT image reconstructions and MIPs were obtained to evaluate the vascular anatomy. CONTRAST:  64mL ISOVUE-370  IOPAMIDOL (ISOVUE-370) INJECTION 76% COMPARISON:  None. FINDINGS: CT HEAD Brain: Generalized age-related cerebral atrophy. Mild chronic microvascular ischemic disease seen involving the periventricular deep white matter both cerebral hemispheres. No acute intracranial hemorrhage. No acute large vessel territory infarct. No mass lesion, midline shift or mass effect. No hydrocephalus. No extra-axial fluid collection. Vascular: No hyperdense vessel. Scattered vascular calcifications noted within the carotid siphons. Skull: Scalp soft tissues and calvarium within normal limits. Sinuses: Visualized paranasal sinuses are clear. Trace bilateral mastoid effusions noted. Orbits: Visualized globes and orbital  soft tissues unremarkable. CTA HEAD Anterior circulation: Visualized distal cervical segments of the internal carotid arteries are widely patent bilaterally. Petrous segments widely patent without stenosis. Scattered atheromatous plaque within the cavernous/supraclinoid ICAs bilaterally, right slightly worse than left. Resultant stenosis of up to approximately 50-70% at the anterior genu of the cavernous right ICA. Associated more mild to moderate narrowing of approximately 30-50% at the cavernous left ICA. ICA termini well perfused. A1 segments patent bilaterally. Normal anterior communicating artery. Scattered atheromatous irregularity within the anterior cerebral arteries without high-grade flow-limiting stenosis. M1 segments patent without stenosis. Normal MCA bifurcations. Distal MCA branches well perfused and symmetric. Posterior circulation: Visualized vertebral arteries patent to the vertebrobasilar junction without stenosis. Dominant right vertebral artery with hypoplastic left vertebral artery. Right PICA patent proximally. Left PICA not visualized. Basilar widely patent to its distal aspect without stenosis. Superior cerebral arteries patent bilaterally. Right PCA supplied via the basilar. Fetal type origin of  the left PCA. PCAs patent to their distal aspects without stenosis. Venous sinuses: Dedicated CT venogram images were performed. Normal opacification of the superior sagittal sinus to the torcula. Transverse and sigmoid sinuses are patent bilaterally as are the visualized proximal internal jugular veins. Straight sinus, vein of Galen, internal cerebral veins, and basal veins of Rosenthal patent. No evidence for dural sinus thrombosis. Anatomic variants: Fetal type origin of the left PCA. No intracranial aneurysm or other vascular abnormality. Slight on CTA views, likely related to contrast phase. Of the posterior circulation Delayed phase: No pathologic enhancement. IMPRESSION: 1. Generalized age-related cerebral atrophy with mild chronic small vessel ischemic disease. No acute intracranial abnormality. 2. Negative CTA for large vessel occlusion or other acute vascular abnormality. No intracranial aneurysm. 3. Atheromatous plaque within the carotid siphons bilaterally with resultant mild to moderate multifocal narrowing as above, right greater than left. No other hemodynamically significant or correctable stenosis. 4. Negative CT venogram.  No evidence for dural sinus thrombosis. Electronically Signed   By: Jeannine Boga M.D.   On: 03/18/2018 22:29   Mr Brain Wo Contrast  Result Date: 03/19/2018 CLINICAL DATA:  Acute onset RIGHT arm numbness, slurred speech and vision loss today. Assess TIA versus migraine. Headache for 4 weeks. History of diabetes, hypercholesterolemia and hypertension. EXAM: MRI HEAD WITHOUT CONTRAST TECHNIQUE: Multiplanar, multiecho pulse sequences of the brain and surrounding structures were obtained without intravenous contrast. COMPARISON:  CT head and venogram March 18, 2018 FINDINGS: INTRACRANIAL CONTENTS: No reduced diffusion to suggest acute ischemia. No susceptibility artifact to suggest hemorrhage. The ventricles and sulci are normal for patient's age. Scattered  subcentimeter supratentorial white matter FLAIR T2 hyperintensities compatible with mild chronic small vessel ischemic changes, normal for age. No suspicious parenchymal signal, masses, mass effect. No abnormal extra-axial fluid collections. No extra-axial masses. VASCULAR: Normal major intracranial vascular flow voids present at skull base. SKULL AND UPPER CERVICAL SPINE: No abnormal sellar expansion. No suspicious calvarial bone marrow signal. Craniocervical junction maintained. SINUSES/ORBITS: Small mastoid effusions. Focal RIGHT maxillary sinus mucosal thickening.The included ocular globes and orbital contents are non-suspicious. Status post bilateral ocular lens implants. OTHER: None. IMPRESSION: Normal noncontrast MRI head for age. Electronically Signed   By: Elon Alas M.D.   On: 03/19/2018 05:32   US Carotid Bilateral (at [facility] And Ap Only)  Result Date: 03/19/2018 CLINICAL DATA:  TIA, hypertension, hyperlipidemia and diabetes. EXAM: BILATERAL CAROTID DUPLEX ULTRASOUND TECHNIQUE: Pearline Cables scale imaging, color Doppler and duplex ultrasound were performed of bilateral carotid and vertebral arteries in the neck. COMPARISON:  None. FINDINGS:  Criteria: Quantification of carotid stenosis is based on velocity parameters that correlate the residual internal carotid diameter with NASCET-based stenosis levels, using the diameter of the distal internal carotid lumen as the denominator for stenosis measurement. The following velocity measurements were obtained: RIGHT ICA:  77/20 cm/sec CCA:  40/08 cm/sec SYSTOLIC ICA/CCA RATIO:  1.1 ECA:  72 cm/sec LEFT ICA:  94/25 cm/sec CCA:  67/61 cm/sec SYSTOLIC ICA/CCA RATIO:  1.2 ECA:  80 cm/sec RIGHT CAROTID ARTERY: There is a mild amount of calcified plaque at the level of the carotid bulb. No evidence of ICA plaque or stenosis. RIGHT VERTEBRAL ARTERY: Antegrade flow with normal waveform and velocity. LEFT CAROTID ARTERY: Minimal amount of partially calcified plaque is  present at the level of the carotid bulb. No evidence of left ICA plaque or stenosis. LEFT VERTEBRAL ARTERY: Antegrade flow with normal waveform and velocity. IMPRESSION: Mild plaque at the level of both carotid bulbs. No evidence of internal carotid artery plaque or stenosis bilaterally. Electronically Signed   By: Aletta Edouard M.D.   On: 03/19/2018 09:09   Ct Venogram Head  Result Date: 03/18/2018 CLINICAL DATA:  Initial evaluation for chronic headache. EXAM: CT ANGIOGRAPHY HEAD CT VENOGRAM HEAD TECHNIQUE: Multidetector CT imaging of the head was performed using the standard protocol during bolus administration of intravenous contrast. Multiplanar CT image reconstructions and MIPs were obtained to evaluate the vascular anatomy. CONTRAST:  94mL ISOVUE-370 IOPAMIDOL (ISOVUE-370) INJECTION 76% COMPARISON:  None. FINDINGS: CT HEAD Brain: Generalized age-related cerebral atrophy. Mild chronic microvascular ischemic disease seen involving the periventricular deep white matter both cerebral hemispheres. No acute intracranial hemorrhage. No acute large vessel territory infarct. No mass lesion, midline shift or mass effect. No hydrocephalus. No extra-axial fluid collection. Vascular: No hyperdense vessel. Scattered vascular calcifications noted within the carotid siphons. Skull: Scalp soft tissues and calvarium within normal limits. Sinuses: Visualized paranasal sinuses are clear. Trace bilateral mastoid effusions noted. Orbits: Visualized globes and orbital soft tissues unremarkable. CTA HEAD Anterior circulation: Visualized distal cervical segments of the internal carotid arteries are widely patent bilaterally. Petrous segments widely patent without stenosis. Scattered atheromatous plaque within the cavernous/supraclinoid ICAs bilaterally, right slightly worse than left. Resultant stenosis of up to approximately 50-70% at the anterior genu of the cavernous right ICA. Associated more mild to moderate narrowing of  approximately 30-50% at the cavernous left ICA. ICA termini well perfused. A1 segments patent bilaterally. Normal anterior communicating artery. Scattered atheromatous irregularity within the anterior cerebral arteries without high-grade flow-limiting stenosis. M1 segments patent without stenosis. Normal MCA bifurcations. Distal MCA branches well perfused and symmetric. Posterior circulation: Visualized vertebral arteries patent to the vertebrobasilar junction without stenosis. Dominant right vertebral artery with hypoplastic left vertebral artery. Right PICA patent proximally. Left PICA not visualized. Basilar widely patent to its distal aspect without stenosis. Superior cerebral arteries patent bilaterally. Right PCA supplied via the basilar. Fetal type origin of the left PCA. PCAs patent to their distal aspects without stenosis. Venous sinuses: Dedicated CT venogram images were performed. Normal opacification of the superior sagittal sinus to the torcula. Transverse and sigmoid sinuses are patent bilaterally as are the visualized proximal internal jugular veins. Straight sinus, vein of Galen, internal cerebral veins, and basal veins of Rosenthal patent. No evidence for dural sinus thrombosis. Anatomic variants: Fetal type origin of the left PCA. No intracranial aneurysm or other vascular abnormality. Slight on CTA views, likely related to contrast phase. Of the posterior circulation Delayed phase: No pathologic enhancement. IMPRESSION: 1. Generalized age-related cerebral  atrophy with mild chronic small vessel ischemic disease. No acute intracranial abnormality. 2. Negative CTA for large vessel occlusion or other acute vascular abnormality. No intracranial aneurysm. 3. Atheromatous plaque within the carotid siphons bilaterally with resultant mild to moderate multifocal narrowing as above, right greater than left. No other hemodynamically significant or correctable stenosis. 4. Negative CT venogram.  No evidence  for dural sinus thrombosis. Electronically Signed   By: Jeannine Boga M.D.   On: 03/18/2018 22:29     Assessment/Plan: 67 y.o. female with a known history per below presents emergency room with 3 to 4 weeks history of headache, developed acute right arm numbness, right-sided vision loss, slurred speech around 4:00pm yesterday that lasted about 5-10 minutes.   Currently back to baseline.  Pt is no on ASA at home due to hx of ulcers.   - Back to baseline - MRI brain no acute abnormalities.  - Carotid US no hemodynamic stenosis - pt/ot - I have asked the patient to start taking ASA coated 81mg  daily  - Magnesium 800mg  daily and Vitamin B complex or B2 daily 1 tab a day for headaches - likely d/c planning today - Neurology follow up as out pt.   03/19/2018, 9:42 AM

## 2018-03-19 NOTE — Discharge Summary (Addendum)
[place] [facility] [place] NAME: Pamela Nash    MR#:  [id number]  DATE OF BIRTH:  Jun 26, 1950  DATE OF ADMISSION:  03/18/2018 ADMITTING PHYSICIAN: Gorden Harms, MD  DATE OF DISCHARGE: No discharge date for patient encounter.  PRIMARY CARE PHYSICIAN: Dion Body, MD    ADMISSION DIAGNOSIS:  TIA (transient ischemic attack) [G45.9]  DISCHARGE DIAGNOSIS:  Active Problems:   TIA (transient ischemic attack)   SECONDARY DIAGNOSIS:   Past Medical History:  Diagnosis Date  . Arthritis   . Atrial flutter ([place])    c-pap dependent  . Breast screening, unspecified   . Complication of anesthesia   . CPAP (continuous positive airway pressure) dependence    FOR PAC'S  . Diabetes ([place])    non-insulin dependent  . Diffuse cystic mastopathy   . Dysrhythmia    PAC'S  . Edema    ANKLES  . Family history of malignant neoplasm of breast   . GERD (gastroesophageal reflux disease)   . High cholesterol   . Obesity, unspecified   . Personal history of colonic polyps   . PONV (postoperative nausea and vomiting)   . Sleep apnea   . Special screening for malignant neoplasms, colon   . Unspecified essential hypertension     HOSPITAL COURSE:   *Acute/subacute headache, right arm numbness, slurred speech, vision loss Exact etiology unknown-TIA versus migraine headache Admitted on our TIA/CVA protocol, neurology did see patient while in house, MRI negative for any acute process, carotid Dopplers negative for any hemodynamically significant stenosis  *Chronic diabetes mellitus type 2 Controlled on current regiment  *Acute hypokalemia Repleted  *Chronic hyperlipidemia, unspecified Continue statin therapy  *Chronic GERD without esophagitis PPI daily  *Chronic obesity Lifestyle modification recommended  *Chronic benign essential hypertension Stable Continue home regiment  *Chronic obstructive sleep apnea CPAP at at  bedtime/as needed  *Acute/subacute headache Per neurology-vitamin B12 daily, add Topamax PRN headache, follow-up with neurology status post discharge for reevaluation in 2 weeks   DISCHARGE CONDITIONS:   stable  CONSULTS OBTAINED:  Treatment Team:  Leotis Pain, MD  DRUG ALLERGIES:   Allergies  Allergen Reactions  . Cefuroxime Axetil Swelling    Felt like throat was swelling; headaches  . Lisinopril     cough  . Losartan Potassium     Weight gain (hctz component negates the reaction)   . Pioglitazone Nausea And Vomiting and Rash    Med caused weight gain  . Sulfa Antibiotics Hives and Rash  . Sulfasalazine Hives and Rash    DISCHARGE MEDICATIONS:   Allergies as of 03/19/2018      Reactions   Cefuroxime Axetil Swelling   Felt like throat was swelling; headaches   Lisinopril    cough   Losartan Potassium    Weight gain (hctz component negates the reaction)    Pioglitazone Nausea And Vomiting, Rash   Med caused weight gain   Sulfa Antibiotics Hives, Rash   Sulfasalazine Hives, Rash      Medication List    TAKE these medications   acetaminophen 500 MG tablet Commonly known as:  TYLENOL Take 1,000 mg by mouth 2 (two) times daily as needed for moderate pain or headache.   aspirin EC 81 MG tablet Take 1 tablet (81 mg total) by mouth daily.   calcium carbonate 500 MG chewable tablet Commonly known as:  TUMS - dosed in mg elemental calcium Chew 1 tablet by mouth daily as needed for indigestion or  heartburn.   dicyclomine 10 MG capsule Commonly known as:  BENTYL Take 10 mg by mouth 4 (four) times daily as needed for spasms.   doxycycline 100 MG capsule Commonly known as:  VIBRAMYCIN Take 1 capsule (100 mg total) by mouth 2 (two) times daily.   fluticasone 50 MCG/ACT nasal spray Commonly known as:  FLONASE Place into the nose.   glimepiride 2 MG tablet Commonly known as:  AMARYL Take 2 mg by mouth 2 (two) times daily.   hydrochlorothiazide 12.5 MG  tablet Commonly known as:  HYDRODIURIL Take 12.5 mg by mouth daily. Along with losartan 50 mg   ibuprofen 200 MG tablet Commonly known as:  ADVIL,MOTRIN Take 400 mg by mouth 2 (two) times daily as needed for headache or moderate pain.   losartan-hydrochlorothiazide 50-12.5 MG tablet Commonly known as:  HYZAAR Take 1 tablet by mouth daily.   Magnesium Oxide 400 MG Caps 1 p.o. daily   meloxicam 15 MG tablet Commonly known as:  MOBIC Take 1 tablet (15 mg total) by mouth daily.   metoprolol tartrate 25 MG tablet Commonly known as:  LOPRESSOR Take 25 mg by mouth 2 (two) times daily.   ONE TOUCH ULTRA TEST test strip Generic drug:  glucose blood   ONETOUCH DELICA LANCETS 97W Misc   pantoprazole 40 MG tablet Commonly known as:  PROTONIX Take by mouth.   rosuvastatin 10 MG tablet Commonly known as:  CRESTOR Take 10 mg by mouth every evening.   topiramate 25 MG tablet Commonly known as:  TOPAMAX Take 1 tablet (25 mg total) by mouth 2 (two) times daily as needed (Headache).   vitamin B-12 1000 MCG tablet Commonly known as:  CYANOCOBALAMIN Take by mouth. What changed:  Another medication with the same name was added. Make sure you understand how and when to take each.   vitamin B-12 1000 MCG tablet Commonly known as:  CYANOCOBALAMIN Take 1 tablet (1,000 mcg total) by mouth daily. What changed:  You were already taking a medication with the same name, and this prescription was added. Make sure you understand how and when to take each.   Vitamin D-3 25 MCG (1000 UT) Caps Take 1,000 Units by mouth daily.   VIVELLE-DOT 0.1 MG/24HR patch Generic drug:  estradiol Place 1 patch onto the skin 2 (two) times a week. Wednesdays and Saturdays        DISCHARGE INSTRUCTIONS:      If you experience worsening of your admission symptoms, develop shortness of breath, life threatening emergency, suicidal or homicidal thoughts you must seek medical attention immediately by calling  911 or calling your MD immediately  if symptoms less severe.  You Must read complete instructions/literature along with all the possible adverse reactions/side effects for all the Medicines you take and that have been prescribed to you. Take any new Medicines after you have completely understood and accept all the possible adverse reactions/side effects.   Please note  You were cared for by a hospitalist during your hospital stay. If you have any questions about your discharge medications or the care you received while you were in the hospital after you are discharged, you can call the unit and asked to speak with the hospitalist on call if the hospitalist that took care of you is not available. Once you are discharged, your primary care physician will handle any further medical issues. Please note that NO REFILLS for any discharge medications will be authorized once you are discharged, as it is imperative  that you return to your primary care physician (or establish a relationship with a primary care physician if you do not have one) for your aftercare needs so that they can reassess your need for medications and monitor your lab values.    Today   CHIEF COMPLAINT:   Chief Complaint  Patient presents with  . Headache    HISTORY OF PRESENT ILLNESS:   67 y.o. female with a known history per below presents emergency room with 3 to 4 weeks history of headache, developed acute right arm numbness, right-sided vision loss, slurred speech around 4:00 today, symptom lasted a few minutes then resolved, in the emergency room patient had extensive work-up that included CT head with angiogram and venogram which were negative for any acute process, potassium 3.3, patient evaluated in the emergency room, neurologically intact, patient is now been admitted for acute TIA versus possible migraine headache.   VITAL SIGNS:  Blood pressure (!) 149/60, pulse 63, temperature 97.6 F (36.4 C), temperature source  Oral, resp. rate 14, height 5\' 3"  (1.6 m), weight 73.1 kg, SpO2 99 %.  I/O:  No intake or output data in the 24 hours ending 03/19/18 1016  PHYSICAL EXAMINATION:  GENERAL:  67 y.o.-year-old patient lying in the bed with no acute distress.  EYES: Pupils equal, round, reactive to light and accommodation. No scleral icterus. Extraocular muscles intact.  HEENT: Head atraumatic, normocephalic. Oropharynx and nasopharynx clear.  NECK:  Supple, no jugular venous distention. No thyroid enlargement, no tenderness.  LUNGS: Normal breath sounds bilaterally, no wheezing, rales,rhonchi or crepitation. No use of accessory muscles of respiration.  CARDIOVASCULAR: S1, S2 normal. No murmurs, rubs, or gallops.  ABDOMEN: Soft, non-tender, non-distended. Bowel sounds present. No organomegaly or mass.  EXTREMITIES: No pedal edema, cyanosis, or clubbing.  NEUROLOGIC: Cranial nerves II through XII are intact. Muscle strength 5/5 in all extremities. Sensation intact. Gait not checked.  PSYCHIATRIC: The patient is alert and oriented x 3.  SKIN: No obvious rash, lesion, or ulcer.   DATA REVIEW:   CBC Recent Labs  Lab 03/18/18 1912  WBC 6.5  HGB 12.2  HCT 37.5  PLT 322    Chemistries  Recent Labs  Lab 03/18/18 1912 03/19/18 0423  NA 136  --   K 3.3*  --   CL 97*  --   CO2 27  --   GLUCOSE 178*  --   BUN 18  --   CREATININE 0.81  --   CALCIUM 8.8*  --   MG  --  1.9  AST 31  --   ALT 22  --   ALKPHOS 53  --   BILITOT 0.9  --     Cardiac Enzymes Recent Labs  Lab 03/18/18 1912  TROPONINI <0.03    Microbiology Results  Results for orders placed or performed during the hospital encounter of 01/27/18  C difficile quick scan w PCR reflex     Status: None   Collection Time: 01/27/18  7:50 AM  Result Value Ref Range Status   C Diff antigen NEGATIVE NEGATIVE Final   C Diff toxin NEGATIVE NEGATIVE Final   C Diff interpretation No C. difficile detected.  Final    Comment: Performed at  [facility], [address] [place], [place] [address]  Gastrointestinal Panel by PCR , Stool     Status: None   Collection Time: 01/27/18  7:50 AM  Result Value Ref Range Status   Campylobacter species NOT DETECTED NOT DETECTED Final  Plesimonas shigelloides NOT DETECTED NOT DETECTED Final   Salmonella species NOT DETECTED NOT DETECTED Final   Yersinia enterocolitica NOT DETECTED NOT DETECTED Final   Vibrio species NOT DETECTED NOT DETECTED Final   Vibrio cholerae NOT DETECTED NOT DETECTED Final   Enteroaggregative E coli (EAEC) NOT DETECTED NOT DETECTED Final   Enteropathogenic E coli (EPEC) NOT DETECTED NOT DETECTED Final   Enterotoxigenic E coli (ETEC) NOT DETECTED NOT DETECTED Final   Shiga like toxin producing E coli (STEC) NOT DETECTED NOT DETECTED Final   Shigella/Enteroinvasive E coli (EIEC) NOT DETECTED NOT DETECTED Final   Cryptosporidium NOT DETECTED NOT DETECTED Final   Cyclospora cayetanensis NOT DETECTED NOT DETECTED Final   Entamoeba histolytica NOT DETECTED NOT DETECTED Final   Giardia lamblia NOT DETECTED NOT DETECTED Final   Adenovirus F40/41 NOT DETECTED NOT DETECTED Final   Astrovirus NOT DETECTED NOT DETECTED Final   Norovirus GI/GII NOT DETECTED NOT DETECTED Final   Rotavirus A NOT DETECTED NOT DETECTED Final   Sapovirus (I, II, IV, and V) NOT DETECTED NOT DETECTED Final    Comment: Performed at [facility], [address] [place], [place] [address]    RADIOLOGY:  Ct Angio Head W Or Wo Contrast  Result Date: 03/18/2018 CLINICAL DATA:  Initial evaluation for chronic headache. EXAM: CT ANGIOGRAPHY HEAD CT VENOGRAM HEAD TECHNIQUE: Multidetector CT imaging of the head was performed using the standard protocol during bolus administration of intravenous contrast. Multiplanar CT image reconstructions and MIPs were obtained to evaluate the vascular anatomy. CONTRAST:  61mL ISOVUE-370 IOPAMIDOL (ISOVUE-370) INJECTION 76% COMPARISON:  None.  FINDINGS: CT HEAD Brain: Generalized age-related cerebral atrophy. Mild chronic microvascular ischemic disease seen involving the periventricular deep white matter both cerebral hemispheres. No acute intracranial hemorrhage. No acute large vessel territory infarct. No mass lesion, midline shift or mass effect. No hydrocephalus. No extra-axial fluid collection. Vascular: No hyperdense vessel. Scattered vascular calcifications noted within the carotid siphons. Skull: Scalp soft tissues and calvarium within normal limits. Sinuses: Visualized paranasal sinuses are clear. Trace bilateral mastoid effusions noted. Orbits: Visualized globes and orbital soft tissues unremarkable. CTA HEAD Anterior circulation: Visualized distal cervical segments of the internal carotid arteries are widely patent bilaterally. Petrous segments widely patent without stenosis. Scattered atheromatous plaque within the cavernous/supraclinoid ICAs bilaterally, right slightly worse than left. Resultant stenosis of up to approximately 50-70% at the anterior genu of the cavernous right ICA. Associated more mild to moderate narrowing of approximately 30-50% at the cavernous left ICA. ICA termini well perfused. A1 segments patent bilaterally. Normal anterior communicating artery. Scattered atheromatous irregularity within the anterior cerebral arteries without high-grade flow-limiting stenosis. M1 segments patent without stenosis. Normal MCA bifurcations. Distal MCA branches well perfused and symmetric. Posterior circulation: Visualized vertebral arteries patent to the vertebrobasilar junction without stenosis. Dominant right vertebral artery with hypoplastic left vertebral artery. Right PICA patent proximally. Left PICA not visualized. Basilar widely patent to its distal aspect without stenosis. Superior cerebral arteries patent bilaterally. Right PCA supplied via the basilar. Fetal type origin of the left PCA. PCAs patent to their distal aspects  without stenosis. Venous sinuses: Dedicated CT venogram images were performed. Normal opacification of the superior sagittal sinus to the torcula. Transverse and sigmoid sinuses are patent bilaterally as are the visualized proximal internal jugular veins. Straight sinus, vein of Galen, internal cerebral veins, and basal veins of Rosenthal patent. No evidence for dural sinus thrombosis. Anatomic variants: Fetal type origin of the left PCA. No intracranial aneurysm or other vascular  abnormality. Slight on CTA views, likely related to contrast phase. Of the posterior circulation Delayed phase: No pathologic enhancement. IMPRESSION: 1. Generalized age-related cerebral atrophy with mild chronic small vessel ischemic disease. No acute intracranial abnormality. 2. Negative CTA for large vessel occlusion or other acute vascular abnormality. No intracranial aneurysm. 3. Atheromatous plaque within the carotid siphons bilaterally with resultant mild to moderate multifocal narrowing as above, right greater than left. No other hemodynamically significant or correctable stenosis. 4. Negative CT venogram.  No evidence for dural sinus thrombosis. Electronically Signed   By: Jeannine Boga M.D.   On: 03/18/2018 22:29   Mr Brain Wo Contrast  Result Date: 03/19/2018 CLINICAL DATA:  Acute onset RIGHT arm numbness, slurred speech and vision loss today. Assess TIA versus migraine. Headache for 4 weeks. History of diabetes, hypercholesterolemia and hypertension. EXAM: MRI HEAD WITHOUT CONTRAST TECHNIQUE: Multiplanar, multiecho pulse sequences of the brain and surrounding structures were obtained without intravenous contrast. COMPARISON:  CT head and venogram March 18, 2018 FINDINGS: INTRACRANIAL CONTENTS: No reduced diffusion to suggest acute ischemia. No susceptibility artifact to suggest hemorrhage. The ventricles and sulci are normal for patient's age. Scattered subcentimeter supratentorial white matter FLAIR T2  hyperintensities compatible with mild chronic small vessel ischemic changes, normal for age. No suspicious parenchymal signal, masses, mass effect. No abnormal extra-axial fluid collections. No extra-axial masses. VASCULAR: Normal major intracranial vascular flow voids present at skull base. SKULL AND UPPER CERVICAL SPINE: No abnormal sellar expansion. No suspicious calvarial bone marrow signal. Craniocervical junction maintained. SINUSES/ORBITS: Small mastoid effusions. Focal RIGHT maxillary sinus mucosal thickening.The included ocular globes and orbital contents are non-suspicious. Status post bilateral ocular lens implants. OTHER: None. IMPRESSION: Normal noncontrast MRI head for age. Electronically Signed   By: Elon Alas M.D.   On: 03/19/2018 05:32   US Carotid Bilateral (at [facility] And [facility] Only)  Result Date: 03/19/2018 CLINICAL DATA:  TIA, hypertension, hyperlipidemia and diabetes. EXAM: BILATERAL CAROTID DUPLEX ULTRASOUND TECHNIQUE: Pearline Cables scale imaging, color Doppler and duplex ultrasound were performed of bilateral carotid and vertebral arteries in the neck. COMPARISON:  None. FINDINGS: Criteria: Quantification of carotid stenosis is based on velocity parameters that correlate the residual internal carotid diameter with NASCET-based stenosis levels, using the diameter of the distal internal carotid lumen as the denominator for stenosis measurement. The following velocity measurements were obtained: RIGHT ICA:  77/20 cm/sec CCA:  86/76 cm/sec SYSTOLIC ICA/CCA RATIO:  1.1 ECA:  72 cm/sec LEFT ICA:  94/25 cm/sec CCA:  19/50 cm/sec SYSTOLIC ICA/CCA RATIO:  1.2 ECA:  80 cm/sec RIGHT CAROTID ARTERY: There is a mild amount of calcified plaque at the level of the carotid bulb. No evidence of ICA plaque or stenosis. RIGHT VERTEBRAL ARTERY: Antegrade flow with normal waveform and velocity. LEFT CAROTID ARTERY: Minimal amount of partially calcified plaque is present at the level of the carotid bulb. No evidence  of left ICA plaque or stenosis. LEFT VERTEBRAL ARTERY: Antegrade flow with normal waveform and velocity. IMPRESSION: Mild plaque at the level of both carotid bulbs. No evidence of internal carotid artery plaque or stenosis bilaterally. Electronically Signed   By: Aletta Edouard M.D.   On: 03/19/2018 09:09   Ct Venogram Head  Result Date: 03/18/2018 CLINICAL DATA:  Initial evaluation for chronic headache. EXAM: CT ANGIOGRAPHY HEAD CT VENOGRAM HEAD TECHNIQUE: Multidetector CT imaging of the head was performed using the standard protocol during bolus administration of intravenous contrast. Multiplanar CT image reconstructions and MIPs were obtained to evaluate the vascular anatomy. CONTRAST:  99mL ISOVUE-370 IOPAMIDOL (ISOVUE-370) INJECTION 76% COMPARISON:  None. FINDINGS: CT HEAD Brain: Generalized age-related cerebral atrophy. Mild chronic microvascular ischemic disease seen involving the periventricular deep white matter both cerebral hemispheres. No acute intracranial hemorrhage. No acute large vessel territory infarct. No mass lesion, midline shift or mass effect. No hydrocephalus. No extra-axial fluid collection. Vascular: No hyperdense vessel. Scattered vascular calcifications noted within the carotid siphons. Skull: Scalp soft tissues and calvarium within normal limits. Sinuses: Visualized paranasal sinuses are clear. Trace bilateral mastoid effusions noted. Orbits: Visualized globes and orbital soft tissues unremarkable. CTA HEAD Anterior circulation: Visualized distal cervical segments of the internal carotid arteries are widely patent bilaterally. Petrous segments widely patent without stenosis. Scattered atheromatous plaque within the cavernous/supraclinoid ICAs bilaterally, right slightly worse than left. Resultant stenosis of up to approximately 50-70% at the anterior genu of the cavernous right ICA. Associated more mild to moderate narrowing of approximately 30-50% at the cavernous left ICA. ICA  termini well perfused. A1 segments patent bilaterally. Normal anterior communicating artery. Scattered atheromatous irregularity within the anterior cerebral arteries without high-grade flow-limiting stenosis. M1 segments patent without stenosis. Normal MCA bifurcations. Distal MCA branches well perfused and symmetric. Posterior circulation: Visualized vertebral arteries patent to the vertebrobasilar junction without stenosis. Dominant right vertebral artery with hypoplastic left vertebral artery. Right PICA patent proximally. Left PICA not visualized. Basilar widely patent to its distal aspect without stenosis. Superior cerebral arteries patent bilaterally. Right PCA supplied via the basilar. Fetal type origin of the left PCA. PCAs patent to their distal aspects without stenosis. Venous sinuses: Dedicated CT venogram images were performed. Normal opacification of the superior sagittal sinus to the torcula. Transverse and sigmoid sinuses are patent bilaterally as are the visualized proximal internal jugular veins. Straight sinus, vein of Galen, internal cerebral veins, and basal veins of Rosenthal patent. No evidence for dural sinus thrombosis. Anatomic variants: Fetal type origin of the left PCA. No intracranial aneurysm or other vascular abnormality. Slight on CTA views, likely related to contrast phase. Of the posterior circulation Delayed phase: No pathologic enhancement. IMPRESSION: 1. Generalized age-related cerebral atrophy with mild chronic small vessel ischemic disease. No acute intracranial abnormality. 2. Negative CTA for large vessel occlusion or other acute vascular abnormality. No intracranial aneurysm. 3. Atheromatous plaque within the carotid siphons bilaterally with resultant mild to moderate multifocal narrowing as above, right greater than left. No other hemodynamically significant or correctable stenosis. 4. Negative CT venogram.  No evidence for dural sinus thrombosis. Electronically Signed    By: Jeannine Boga M.D.   On: 03/18/2018 22:29    EKG:   Orders placed or performed during the hospital encounter of 03/18/18  . ED EKG  . ED EKG      Management plans discussed with the patient, family and they are in agreement.  CODE STATUS:     Code Status Orders  (From admission, onward)         Start     Ordered   03/19/18 0045  Full code  Continuous     03/19/18 0044        Code Status History    This patient has a current code status but no historical code status.      TOTAL TIME TAKING CARE OF THIS PATIENT: 40 minutes.    Avel Peace Jaylia Nash M.D on 03/19/2018 at 10:16 AM  Between 7am to 6pm - Pager - [phone]  After 6pm go to [email or web address] - password [email or web address]  [employer]  [phone]  CC: Primary care physician; Dion Body, MD   Note: This dictation was prepared with Dragon dictation along with smaller phrase technology. Any transcriptional errors that result from this process are unintentional.

## 2018-03-19 NOTE — Progress Notes (Signed)
Patient in no distress at this time. On room air. Declined cpap. Will have RN to call if changes mind.

## 2018-03-19 NOTE — ED Notes (Signed)
RN unable to receive report at this time.

## 2018-03-19 NOTE — Progress Notes (Signed)
Pt being discharged home, discharge instructions and prescriptions reviewed with pt, daughter and husband, states understanding, pt with no complaints

## 2018-03-19 NOTE — Plan of Care (Signed)
Pt. Demonstrates knowledge of education provided. Pt MRI neg for CVA. In bedside rounding with Ashok Norris, MD  patient requested to proceeded with discharge prior to being seen by physical therapy. As pt is independent in room, and imaging results show no sign of CVA. MD stated to proceed with discharge as patient and family request.

## 2018-03-19 NOTE — Plan of Care (Signed)

## 2018-03-19 NOTE — ED Notes (Signed)
ED TO INPATIENT HANDOFF REPORT  Name/Age/Gender Pamela Nash 67 y.o. female  Code Status   Home/SNF/Other Home  Chief Complaint Headache  Level of Care/Admitting Diagnosis ED Disposition    ED Disposition Condition [place]: [place] [100120]  Level of Care: Med-Surg [16]  Diagnosis: TIA (transient ischemic attack) [448185]  Admitting Physician: Gorden Harms [[id number]]  Attending Physician: Gorden Harms [[id number]]  PT Class (Do Not Modify): Observation [104]  PT Acc Code (Do Not Modify): Observation [10022]       Medical History Past Medical History:  Diagnosis Date  . Arthritis   . Atrial flutter ([place])    c-pap dependent  . Breast screening, unspecified   . Complication of anesthesia   . CPAP (continuous positive airway pressure) dependence    FOR PAC'S  . Diabetes ([place])    non-insulin dependent  . Diffuse cystic mastopathy   . Dysrhythmia    PAC'S  . Edema    ANKLES  . Family history of malignant neoplasm of breast   . GERD (gastroesophageal reflux disease)   . High cholesterol   . Obesity, unspecified   . Personal history of colonic polyps   . PONV (postoperative nausea and vomiting)   . Sleep apnea   . Special screening for malignant neoplasms, colon   . Unspecified essential hypertension     Allergies Allergies  Allergen Reactions  . Cefuroxime Axetil Swelling    Felt like throat was swelling; headaches  . Lisinopril     cough  . Losartan Potassium     Weight gain (hctz component negates the reaction)   . Pioglitazone Nausea And Vomiting and Rash    Med caused weight gain  . Sulfa Antibiotics Hives and Rash  . Sulfasalazine Hives and Rash    IV Location/Drains/Wounds Patient Lines/Drains/Airways Status   Active Line/Drains/Airways    Name:   Placement date:   Placement time:   Site:   Days:   Peripheral IV 03/18/18 Right Antecubital   03/18/18    1924    Antecubital   1    Airway   12/16/17    0807     93   Incision (Closed) 11/13/16 Eye Right   11/13/16    0708     491   Incision (Closed) 12/04/16 Eye Left   12/04/16    1210     470          Labs/Imaging Results for orders placed or performed during the hospital encounter of 03/18/18 (from the past 48 hour(s))  Glucose, capillary     Status: Abnormal   Collection Time: 03/18/18  7:00 PM  Result Value Ref Range   Glucose-Capillary 156 (H) 70 - 99 mg/dL  Ethanol     Status: None   Collection Time: 03/18/18  7:12 PM  Result Value Ref Range   Alcohol, Ethyl (B) <10 <10 mg/dL    Comment: (NOTE) Lowest detectable limit for serum alcohol is 10 mg/dL. For medical purposes only. Performed at [facility], [address] [place], [place] [address]   Protime-INR     Status: None   Collection Time: 03/18/18  7:12 PM  Result Value Ref Range   Prothrombin Time 14.0 11.4 - 15.2 seconds   INR 1.09     Comment: Performed at [facility], [address] [place], [place] [address]  APTT     Status: None   Collection Time: 03/18/18  7:12 PM  Result Value Ref Range   aPTT 31 24 - 36 seconds    Comment: Performed at [facility], [address] [place], [place] [address]  CBC     Status: None   Collection Time: 03/18/18  7:12 PM  Result Value Ref Range   WBC 6.5 4.0 - 10.5 K/uL   RBC 4.36 3.87 - 5.11 MIL/uL   Hemoglobin 12.2 12.0 - 15.0 g/dL   HCT 37.5 36.0 - 46.0 %   MCV 86.0 80.0 - 100.0 fL   MCH 28.0 26.0 - 34.0 pg   MCHC 32.5 30.0 - 36.0 g/dL   RDW 11.9 11.5 - 15.5 %   Platelets 322 150 - 400 K/uL   nRBC 0.0 0.0 - 0.2 %    Comment: Performed at [facility], [address] [place], [place] [address]  Differential     Status: None   Collection Time: 03/18/18  7:12 PM  Result Value Ref Range   Neutrophils Relative % 48 %   Neutro Abs 3.1 1.7 - 7.7 K/uL   Lymphocytes Relative 40 %   Lymphs Abs 2.6 0.7 - 4.0 K/uL   Monocytes Relative 9 %   Monocytes  Absolute 0.6 0.1 - 1.0 K/uL   Eosinophils Relative 2 %   Eosinophils Absolute 0.1 0.0 - 0.5 K/uL   Basophils Relative 1 %   Basophils Absolute 0.0 0.0 - 0.1 K/uL   Immature Granulocytes 0 %   Abs Immature Granulocytes 0.01 0.00 - 0.07 K/uL    Comment: Performed at [facility], [address] [place], [place] [address]  Comprehensive metabolic panel     Status: Abnormal   Collection Time: 03/18/18  7:12 PM  Result Value Ref Range   Sodium 136 135 - 145 mmol/L   Potassium 3.3 (L) 3.5 - 5.1 mmol/L   Chloride 97 (L) 98 - 111 mmol/L   CO2 27 22 - 32 mmol/L   Glucose, Bld 178 (H) 70 - 99 mg/dL   BUN 18 8 - 23 mg/dL   Creatinine, Ser 0.81 0.44 - 1.00 mg/dL   Calcium 8.8 (L) 8.9 - 10.3 mg/dL   Total Protein 7.4 6.5 - 8.1 g/dL   Albumin 4.0 3.5 - 5.0 g/dL   AST 31 15 - 41 U/L   ALT 22 0 - 44 U/L   Alkaline Phosphatase 53 38 - 126 U/L   Total Bilirubin 0.9 0.3 - 1.2 mg/dL   GFR calc non Af Amer >60 >60 mL/min   GFR calc Af Amer >60 >60 mL/min   Anion gap 12 5 - 15    Comment: Performed at [facility], [address] [place], [place] [address]  Troponin I - ONCE - STAT     Status: None   Collection Time: 03/18/18  7:12 PM  Result Value Ref Range   Troponin I <0.03 <0.03 ng/mL    Comment: Performed at [facility], [address] [place], [place] [address]  Urinalysis, Complete w Microscopic     Status: Abnormal   Collection Time: 03/18/18  8:28 PM  Result Value Ref Range   Color, Urine YELLOW (A) YELLOW   APPearance CLEAR (A) CLEAR   Specific Gravity, Urine 1.009 1.005 - 1.030   pH 6.0 5.0 - 8.0   Glucose, UA 50 (A) NEGATIVE mg/dL   Hgb urine dipstick NEGATIVE NEGATIVE   Bilirubin Urine NEGATIVE NEGATIVE   Ketones, ur NEGATIVE NEGATIVE mg/dL   Protein, ur NEGATIVE  NEGATIVE mg/dL   Nitrite NEGATIVE NEGATIVE   Leukocytes, UA NEGATIVE NEGATIVE   RBC / HPF 0-5 0 - 5 RBC/hpf   WBC, UA 0-5 0 - 5 WBC/hpf   Bacteria, UA RARE (A) NONE SEEN   Squamous  Epithelial / LPF 0-5 0 - 5    Comment: Performed at [facility], [address], [place], [place] [address]   Ct Angio Head W Or Wo Contrast  Result Date: 03/18/2018 CLINICAL DATA:  Initial evaluation for chronic headache. EXAM: CT ANGIOGRAPHY HEAD CT VENOGRAM HEAD TECHNIQUE: Multidetector CT imaging of the head was performed using the standard protocol during bolus administration of intravenous contrast. Multiplanar CT image reconstructions and MIPs were obtained to evaluate the vascular anatomy. CONTRAST:  5mL ISOVUE-370 IOPAMIDOL (ISOVUE-370) INJECTION 76% COMPARISON:  None. FINDINGS: CT HEAD Brain: Generalized age-related cerebral atrophy. Mild chronic microvascular ischemic disease seen involving the periventricular deep white matter both cerebral hemispheres. No acute intracranial hemorrhage. No acute large vessel territory infarct. No mass lesion, midline shift or mass effect. No hydrocephalus. No extra-axial fluid collection. Vascular: No hyperdense vessel. Scattered vascular calcifications noted within the carotid siphons. Skull: Scalp soft tissues and calvarium within normal limits. Sinuses: Visualized paranasal sinuses are clear. Trace bilateral mastoid effusions noted. Orbits: Visualized globes and orbital soft tissues unremarkable. CTA HEAD Anterior circulation: Visualized distal cervical segments of the internal carotid arteries are widely patent bilaterally. Petrous segments widely patent without stenosis. Scattered atheromatous plaque within the cavernous/supraclinoid ICAs bilaterally, right slightly worse than left. Resultant stenosis of up to approximately 50-70% at the anterior genu of the cavernous right ICA. Associated more mild to moderate narrowing of approximately 30-50% at the cavernous left ICA. ICA termini well perfused. A1 segments patent bilaterally. Normal anterior communicating artery. Scattered atheromatous irregularity within the anterior cerebral arteries without  high-grade flow-limiting stenosis. M1 segments patent without stenosis. Normal MCA bifurcations. Distal MCA branches well perfused and symmetric. Posterior circulation: Visualized vertebral arteries patent to the vertebrobasilar junction without stenosis. Dominant right vertebral artery with hypoplastic left vertebral artery. Right PICA patent proximally. Left PICA not visualized. Basilar widely patent to its distal aspect without stenosis. Superior cerebral arteries patent bilaterally. Right PCA supplied via the basilar. Fetal type origin of the left PCA. PCAs patent to their distal aspects without stenosis. Venous sinuses: Dedicated CT venogram images were performed. Normal opacification of the superior sagittal sinus to the torcula. Transverse and sigmoid sinuses are patent bilaterally as are the visualized proximal internal jugular veins. Straight sinus, vein of Galen, internal cerebral veins, and basal veins of Rosenthal patent. No evidence for dural sinus thrombosis. Anatomic variants: Fetal type origin of the left PCA. No intracranial aneurysm or other vascular abnormality. Slight on CTA views, likely related to contrast phase. Of the posterior circulation Delayed phase: No pathologic enhancement. IMPRESSION: 1. Generalized age-related cerebral atrophy with mild chronic small vessel ischemic disease. No acute intracranial abnormality. 2. Negative CTA for large vessel occlusion or other acute vascular abnormality. No intracranial aneurysm. 3. Atheromatous plaque within the carotid siphons bilaterally with resultant mild to moderate multifocal narrowing as above, right greater than left. No other hemodynamically significant or correctable stenosis. 4. Negative CT venogram.  No evidence for dural sinus thrombosis. Electronically Signed   By: Jeannine Boga M.D.   On: 03/18/2018 22:29   Ct Venogram Head  Result Date: 03/18/2018 CLINICAL DATA:  Initial evaluation for chronic headache. EXAM: CT  ANGIOGRAPHY HEAD CT VENOGRAM HEAD TECHNIQUE: Multidetector CT imaging of the head was  performed using the standard protocol during bolus administration of intravenous contrast. Multiplanar CT image reconstructions and MIPs were obtained to evaluate the vascular anatomy. CONTRAST:  52mL ISOVUE-370 IOPAMIDOL (ISOVUE-370) INJECTION 76% COMPARISON:  None. FINDINGS: CT HEAD Brain: Generalized age-related cerebral atrophy. Mild chronic microvascular ischemic disease seen involving the periventricular deep white matter both cerebral hemispheres. No acute intracranial hemorrhage. No acute large vessel territory infarct. No mass lesion, midline shift or mass effect. No hydrocephalus. No extra-axial fluid collection. Vascular: No hyperdense vessel. Scattered vascular calcifications noted within the carotid siphons. Skull: Scalp soft tissues and calvarium within normal limits. Sinuses: Visualized paranasal sinuses are clear. Trace bilateral mastoid effusions noted. Orbits: Visualized globes and orbital soft tissues unremarkable. CTA HEAD Anterior circulation: Visualized distal cervical segments of the internal carotid arteries are widely patent bilaterally. Petrous segments widely patent without stenosis. Scattered atheromatous plaque within the cavernous/supraclinoid ICAs bilaterally, right slightly worse than left. Resultant stenosis of up to approximately 50-70% at the anterior genu of the cavernous right ICA. Associated more mild to moderate narrowing of approximately 30-50% at the cavernous left ICA. ICA termini well perfused. A1 segments patent bilaterally. Normal anterior communicating artery. Scattered atheromatous irregularity within the anterior cerebral arteries without high-grade flow-limiting stenosis. M1 segments patent without stenosis. Normal MCA bifurcations. Distal MCA branches well perfused and symmetric. Posterior circulation: Visualized vertebral arteries patent to the vertebrobasilar junction without  stenosis. Dominant right vertebral artery with hypoplastic left vertebral artery. Right PICA patent proximally. Left PICA not visualized. Basilar widely patent to its distal aspect without stenosis. Superior cerebral arteries patent bilaterally. Right PCA supplied via the basilar. Fetal type origin of the left PCA. PCAs patent to their distal aspects without stenosis. Venous sinuses: Dedicated CT venogram images were performed. Normal opacification of the superior sagittal sinus to the torcula. Transverse and sigmoid sinuses are patent bilaterally as are the visualized proximal internal jugular veins. Straight sinus, vein of Galen, internal cerebral veins, and basal veins of Rosenthal patent. No evidence for dural sinus thrombosis. Anatomic variants: Fetal type origin of the left PCA. No intracranial aneurysm or other vascular abnormality. Slight on CTA views, likely related to contrast phase. Of the posterior circulation Delayed phase: No pathologic enhancement. IMPRESSION: 1. Generalized age-related cerebral atrophy with mild chronic small vessel ischemic disease. No acute intracranial abnormality. 2. Negative CTA for large vessel occlusion or other acute vascular abnormality. No intracranial aneurysm. 3. Atheromatous plaque within the carotid siphons bilaterally with resultant mild to moderate multifocal narrowing as above, right greater than left. No other hemodynamically significant or correctable stenosis. 4. Negative CT venogram.  No evidence for dural sinus thrombosis. Electronically Signed   By: Jeannine Boga M.D.   On: 03/18/2018 22:29    Pending Labs [employer] (From admission, onward)    Start     Ordered   Signed and Held  HIV antibody (Routine Testing)  Once,   R     Signed and Held   Signed and Held  Hemoglobin A1c  Tomorrow morning,   R     Signed and Held   Signed and Held  Lipid panel  Tomorrow morning,   R    Comments:  Fasting    Signed and Held   Signed and Held  CBC   (enoxaparin (LOVENOX)    CrCl >/= 30 ml/min)  Once,   R    Comments:  Baseline for enoxaparin therapy IF NOT ALREADY DRAWN.  Notify MD if PLT < 100 K.    Signed  and Held   Signed and Held  Creatinine, serum  (enoxaparin (LOVENOX)    CrCl >/= 30 ml/min)  Once,   R    Comments:  Baseline for enoxaparin therapy IF NOT ALREADY DRAWN.    Signed and Held   Signed and Held  Creatinine, serum  (enoxaparin (LOVENOX)    CrCl >/= 30 ml/min)  Weekly,   R    Comments:  while on enoxaparin therapy    Signed and Held   Signed and Held  Lipid panel  Tomorrow morning,   R     Signed and Held          Vitals/Pain Today's Vitals   03/18/18 2230 03/18/18 2300 03/18/18 2315 03/18/18 2330  BP: (!) 146/70 (!) 152/73  (!) 152/66  Pulse: 69 69 68 61  Resp: 18 18 17 16   Temp:      TempSrc:      SpO2: 99% 100% 98% 97%  Weight:      Height:      PainSc:        Isolation Precautions No active isolations  Medications Medications  acetaminophen (TYLENOL) tablet 650 mg (650 mg Oral Given 03/18/18 2022)  iopamidol (ISOVUE-370) 76 % injection 75 mL (75 mLs Intravenous Contrast Given 03/18/18 2101)  aspirin chewable tablet 324 mg (324 mg Oral Given 03/18/18 2321)    Mobility walks

## 2018-03-19 NOTE — Progress Notes (Signed)
Family Meeting Note  Advance Directive:yes  Today a meeting took place with the Patient.  Patient is able to participate   The following clinical team members were present during this meeting:MD  The following were discussed:Patient's diagnosis: TIA versus migrainous headache, diabetes, obesity, obstructive sleep apnea, hyperlipidemia, hypertension, Patient's progosis: Unable to determine and Goals for treatment: Full Code  Additional follow-up to be provided: prn  Time spent during discussion:20 minutes  Gorden Harms, MD

## 2018-03-19 NOTE — Care Management Obs Status (Signed)
[place] NOTIFICATION   Patient Details  Name: Pamela Nash MRN: [id number] Date of Birth: 03/05/1951   Medicare Observation Status Notification Given:  Yes    Marshell Garfinkel, RN 03/19/2018, 7:38 AM

## 2018-03-20 LAB — HEMOGLOBIN A1C
Hgb A1c MFr Bld: 7.3 % — ABNORMAL HIGH (ref 4.8–5.6)
Mean Plasma Glucose: 163 mg/dL

## 2018-03-21 LAB — HIV ANTIBODY (ROUTINE TESTING W REFLEX): HIV SCREEN 4TH GENERATION: NONREACTIVE

## 2018-04-07 ENCOUNTER — Ambulatory Visit (INDEPENDENT_AMBULATORY_CARE_PROVIDER_SITE_OTHER): Payer: Medicare Other | Admitting: Podiatry

## 2018-04-07 ENCOUNTER — Encounter: Payer: Self-pay | Admitting: Podiatry

## 2018-04-07 DIAGNOSIS — M722 Plantar fascial fibromatosis: Secondary | ICD-10-CM

## 2018-04-07 DIAGNOSIS — L603 Nail dystrophy: Secondary | ICD-10-CM | POA: Diagnosis not present

## 2018-04-07 NOTE — Progress Notes (Signed)
She presents today for follow-up of plantar fasciitis she states that is much better is 100% better and thus to monitor for a long period of time.  She states that back over the holidays around the 24th and 25 December she had a small TIA possibly associated with hypertension.  She presents today concerned about her onychomycosis.  Objective: Vital signs are stable she alert oriented x3 no longer has pain to palpation medial calcaneal tubercle of the left heel.  Onychomycosis fourth digit left foot is minimal more with nail dystrophy per pathology result.  Assessment: Nail dystrophy fourth left.  Resolved plantar fasciitis left.  Plan: Follow-up with me on an as-needed basis.

## 2018-06-03 ENCOUNTER — Ambulatory Visit: Payer: Medicare Other | Admitting: Dietician

## 2018-06-12 ENCOUNTER — Encounter: Payer: Self-pay | Admitting: Dietician

## 2018-08-15 ENCOUNTER — Other Ambulatory Visit: Payer: Self-pay | Admitting: Family Medicine

## 2018-08-15 DIAGNOSIS — Z1231 Encounter for screening mammogram for malignant neoplasm of breast: Secondary | ICD-10-CM

## 2018-09-03 ENCOUNTER — Other Ambulatory Visit: Payer: Self-pay

## 2018-09-03 MED ORDER — MELOXICAM 15 MG PO TABS: 15.0000 mg | ORAL_TABLET | Freq: Every day | ORAL | 3 refills | Status: DC

## 2018-09-03 NOTE — Telephone Encounter (Signed)
Pharmacy refill request for Meloxicam 15mg    Per Dr. Stephenie Acres verbal order, ok to refill.   Script has been sent to pharmacy

## 2018-09-15 ENCOUNTER — Encounter: Payer: Medicare Other | Attending: Surgery | Admitting: *Deleted

## 2018-09-15 ENCOUNTER — Other Ambulatory Visit: Payer: Self-pay

## 2018-09-15 ENCOUNTER — Encounter: Payer: Self-pay | Admitting: *Deleted

## 2018-09-15 VITALS — BP 110/60 | Ht 63.5 in | Wt 149.6 lb

## 2018-09-15 DIAGNOSIS — Z6826 Body mass index (BMI) 26.0-26.9, adult: Secondary | ICD-10-CM | POA: Insufficient documentation

## 2018-09-15 DIAGNOSIS — Z713 Dietary counseling and surveillance: Secondary | ICD-10-CM | POA: Diagnosis not present

## 2018-09-15 DIAGNOSIS — E119 Type 2 diabetes mellitus without complications: Secondary | ICD-10-CM | POA: Diagnosis not present

## 2018-09-15 NOTE — Progress Notes (Signed)
Diabetes Self-Management Education  Visit Type: First/Initial  Appt. Start Time: 0900 Appt. End Time: 0277  09/15/2018  [name], identified by name and date of birth, is a 68 y.o. female with a diagnosis of Diabetes: Type 2.   ASSESSMENT  Blood pressure 110/60, height 5' 3.5" (1.613 m), weight 149 lb 9.6 oz (67.9 kg). Body mass index is 26.08 kg/m.  Diabetes Self-Management Education - 09/15/18 1039      Visit Information   Visit Type  First/Initial      Initial Visit   Diabetes Type  Type 2    Are you currently following a meal plan?  No    Are you taking your medications as prescribed?  Yes    Date Diagnosed  15 years ago      Health Coping   How would you rate your overall health?  Good      Psychosocial Assessment   Patient Belief/Attitude about Diabetes  Motivated to manage diabetes   "frustrated"   Self-care barriers  None    Self-management support  Doctor's office;Family    Patient Concerns  Nutrition/Meal planning;Glycemic Control;Medication;Monitoring;Healthy Lifestyle;Weight Control    Special Needs  None    Preferred Learning Style  Visual    Learning Readiness  Change in progress    How often do you need to have someone help you when you read instructions, pamphlets, or other written materials from your doctor or pharmacy?  1 - Never      Pre-Education Assessment   Patient understands the diabetes disease and treatment process.  Needs Review    Patient understands incorporating nutritional management into lifestyle.  Needs Review    Patient undertands incorporating physical activity into lifestyle.  Demonstrates understanding / competency    Patient understands using medications safely.  Needs Review    Patient understands monitoring blood glucose, interpreting and using results  Needs Review    Patient understands prevention, detection, and treatment of acute complications.  Needs Instruction    Patient understands prevention, detection, and  treatment of chronic complications.  Needs Review    Patient understands how to develop strategies to address psychosocial issues.  Needs Review    Patient understands how to develop strategies to promote health/change behavior.  Needs Review      Complications   Last HgB A1C per patient/outside source  8.5 %   07/02/2018   How often do you check your blood sugar?  1-2 times/day    Fasting Blood glucose range (mg/dL)  130-179   Pt reports FBG's 124-130's mg/dL.   Postprandial Blood glucose range (mg/dL)  --   Pt reports bedtime readings 180's mg/dL   Number of hypoglycemic episodes per month  1    Can you tell when your blood sugar is low?  Yes    What do you do if your blood sugar is low?  treated with chocolate candy    Have you had a dilated eye exam in the past 12 months?  Yes    Have you had a dental exam in the past 12 months?  Yes    Are you checking your feet?  Yes    How many days per week are you checking your feet?  7      Dietary Intake   Breakfast  cereal and milk; yogurt and nuts; egg, bacon or sausage and toast    Snack (morning)  peanut butter crackers    Lunch  salad with deli ham or chicken; meat  sandwich; protein drink    Snack (afternoon)  peanut butter crackers    Dinner  chicken, beef, pork, occasional fish with potatoes, bread, peas, beans, rice, pasta, broccoli, lettuce, tomatoes, carrots, cuccumbers, brocoli, cauliflower, green beans    Beverage(s)  water, diet Coke, Crystal lite      Exercise   Exercise Type  Light (walking / raking leaves)   walk, tai chi   How many days per week to you exercise?  7    How many minutes per day do you exercise?  40    Total minutes per week of exercise  280      Patient Education   Previous Diabetes Education  Yes (please comment)   2007 at [facility]   Disease state   Factors that contribute to the development of diabetes;Explored patient's options for treatment of their diabetes    Nutrition management   Food label reading,  portion sizes and measuring food.;Carbohydrate counting;Reviewed blood glucose goals for pre and post meals and how to evaluate the patients' food intake on their blood glucose level.    Physical activity and exercise   Role of exercise on diabetes management, blood pressure control and cardiac health.    Medications  Reviewed patients medication for diabetes, action, purpose, timing of dose and side effects.    Acute complications  Taught treatment of hypoglycemia - the 15 rule.    Chronic complications  Relationship between chronic complications and blood glucose control;Reviewed with patient heart disease, higher risk of, and prevention    Psychosocial adjustment  Identified and addressed patients feelings and concerns about diabetes      Individualized Goals (developed by patient)   Reducing Risk  Improve blood sugars Decrease medications Prevent diabetes complications Lose weight Lead a healthier lifestyle Become more fit     Outcomes   Expected Outcomes  Demonstrated interest in learning. Expect positive outcomes    Future DMSE  2 wks       Individualized Plan for Diabetes Self-Management Training:   Learning Objective:  Patient will have a greater understanding of diabetes self-management. Patient education plan is to attend individual and/or group sessions per assessed needs and concerns.   Plan:   Patient Instructions  Check blood sugars 2 x day before breakfast and at bedtime every day Bring blood sugar records to the next appointment Exercise: Continue program for  40  minutes   7  days a week Eat 3 meals day,  1-2  snacks a day Space meals 4-6 hours apart Complete 3 Day Food Record and bring to next appt Carry fast acting glucose and a snack at all times Return for appointment on: Monday September 29, 2018 at 9:00 am with Freda Munro (nurse)  Expected Outcomes:  Demonstrated interest in learning. Expect positive outcomes  Education material provided:  General Meal Planning  Guidelines Simple Meal Plan 3 Day Food Record Glucose tablets Symptoms, causes and treatments of Hypoglycemia  If problems or questions, patient to contact team via:  Johny Drilling, [place], [place], CDE [phone]  Future DSME appointment: 2 wks  September 29 2018 with the nurse

## 2018-09-15 NOTE — Patient Instructions (Addendum)
Check blood sugars 2 x day before breakfast and at bedtime every day Bring blood sugar records to the next appointment  Exercise: Continue program for  40  minutes   7  days a week  Eat 3 meals day,  1-2  snacks a day Space meals 4-6 hours apart Complete 3 Day Food Record and bring to next appt  Carry fast acting glucose and a snack at all times  Return for appointment on: Monday September 29, 2018 at 9:00 am with Freda Munro (nurse)

## 2018-09-17 ENCOUNTER — Encounter: Payer: Self-pay | Admitting: Podiatry

## 2018-09-17 ENCOUNTER — Other Ambulatory Visit: Payer: Self-pay

## 2018-09-17 ENCOUNTER — Ambulatory Visit: Payer: Medicare Other | Admitting: Podiatry

## 2018-09-17 VITALS — Temp 97.1°F

## 2018-09-17 DIAGNOSIS — M722 Plantar fascial fibromatosis: Secondary | ICD-10-CM

## 2018-09-17 NOTE — Progress Notes (Signed)
She presents today for follow-up of her left heel states that is flared up again I been walking 2 to 3 miles a day so I think that is probably what has upset it.  Objective: Vital signs are stable alert oriented x3.  Pulses are palpable left foot.  She has pain to palpation medial [place] tubercle of the left heel.  Assessment: Plantar fasciitis left.  Plan: I am going to reinject her left heel today 20 mg Kenalog 5 mg Marcaine for maximal tenderness.  Discussed appropriate shoe gear stretching exercises ice therapy sugar modifications the fact that we may need orthotics.

## 2018-09-22 ENCOUNTER — Other Ambulatory Visit: Payer: Self-pay

## 2018-09-22 ENCOUNTER — Ambulatory Visit
Admission: RE | Admit: 2018-09-22 | Discharge: 2018-09-22 | Disposition: A | Discharge status: Home / Self Care | Payer: Medicare Other | Source: Ambulatory Visit | Attending: Family Medicine | Admitting: Family Medicine

## 2018-09-22 DIAGNOSIS — Z1231 Encounter for screening mammogram for malignant neoplasm of breast: Secondary | ICD-10-CM | POA: Insufficient documentation

## 2018-09-29 ENCOUNTER — Encounter: Payer: Medicare Other | Attending: Surgery | Admitting: *Deleted

## 2018-09-29 ENCOUNTER — Other Ambulatory Visit: Payer: Self-pay

## 2018-09-29 ENCOUNTER — Encounter: Payer: Self-pay | Admitting: *Deleted

## 2018-09-29 VITALS — BP 98/60 | Wt 149.1 lb

## 2018-09-29 DIAGNOSIS — Z6826 Body mass index (BMI) 26.0-26.9, adult: Secondary | ICD-10-CM | POA: Insufficient documentation

## 2018-09-29 DIAGNOSIS — E119 Type 2 diabetes mellitus without complications: Secondary | ICD-10-CM | POA: Diagnosis not present

## 2018-09-29 DIAGNOSIS — Z713 Dietary counseling and surveillance: Secondary | ICD-10-CM | POA: Diagnosis not present

## 2018-09-29 NOTE — Progress Notes (Signed)
Diabetes Self-Management Education  Visit Type:  Follow-up  Appt. Start Time: 0850 Appt. End Time: 0945  09/29/2018  [name], identified by name and date of birth, is a 68 y.o. female with a diagnosis of Diabetes: Type 2.   ASSESSMENT  Blood pressure 98/60, weight 149 lb 1.6 oz (67.6 kg). Body mass index is 26 kg/m.   Diabetes Self-Management Education - 09/29/18 1051      Initial Visit   What type of meal plan do you follow?  ADA      Complications   How often do you check your blood sugar?  1-2 times/day    Fasting Blood glucose range (mg/dL)  70-129   FBG's 89-130 mg/dL   Postprandial Blood glucose range (mg/dL)  70-129;130-179;180-200;>200   pp's 135-238 mg/dL   Number of hypoglycemic episodes per month  0    Can you tell when your blood sugar is low?  Yes    What do you do if your blood sugar is low?  carrying glucose tablets and snack    Have you had a dilated eye exam in the past 12 months?  Yes    Have you had a dental exam in the past 12 months?  Yes    Are you checking your feet?  Yes    How many days per week are you checking your feet?  7      Dietary Intake   Breakfast  see food diary      Exercise   Exercise Type  Light (walking / raking leaves)    How many days per week to you exercise?  7    How many minutes per day do you exercise?  40    Total minutes per week of exercise  280      Patient Education   Disease state   Explored patient's options for treatment of their diabetes    Nutrition management   Food label reading, portion sizes and measuring food.;Carbohydrate counting;Reviewed blood glucose goals for pre and post meals and how to evaluate the patients' food intake on their blood glucose level.;Meal options for control of blood glucose level and chronic complications.    Physical activity and exercise   Role of exercise on diabetes management, blood pressure control and cardiac health.    Medications  Reviewed patients medication for  diabetes, action, purpose, timing of dose and side effects.    Monitoring  Purpose and frequency of SMBG.;Taught/discussed recording of test results and interpretation of SMBG.;Identified appropriate SMBG and/or A1C goals.    Acute complications  Taught treatment of hypoglycemia - the 15 rule.    Chronic complications  Relationship between chronic complications and blood glucose control    Psychosocial adjustment  Identified and addressed patients feelings and concerns about diabetes      Individualized Goals (developed by patient)   Nutrition  Follow meal plan discussed    Physical Activity  Exercise 5-7 days per week;45 minutes per day    Medications  take my medication as prescribed    Monitoring   test my blood glucose as discussed    Reducing Risk  examine blood glucose patterns;treat hypoglycemia with 15 grams of carbs if blood glucose less than 70mg /dL      Post-Education Assessment   Patient understands the diabetes disease and treatment process.  Demonstrates understanding / competency    Patient understands incorporating nutritional management into lifestyle.  Demonstrates understanding / competency    Patient undertands incorporating physical activity  into lifestyle.  Demonstrates understanding / competency    Patient understands using medications safely.  Demonstrates understanding / competency    Patient understands monitoring blood glucose, interpreting and using results  Demonstrates understanding / competency    Patient understands prevention, detection, and treatment of acute complications.  Demonstrates understanding / competency    Patient understands prevention, detection, and treatment of chronic complications.  Demonstrates understanding / competency    Patient understands how to develop strategies to address psychosocial issues.  Demonstrates understanding / competency      Outcomes   Program Status  Completed      Subsequent Visit   Since your last visit have you  continued or begun to take your medications as prescribed?  Yes    Since your last visit have you had your blood pressure checked?  No    Since your last visit have you experienced any weight changes?  Loss    Weight Loss (lbs)  0.5    Since your last visit, are you checking your blood glucose at least once a day?  Yes       Learning Objective:  Patient will have a greater understanding of diabetes self-management. Patient education plan is to attend individual and/or group sessions per assessed needs and concerns.   Plan:   Patient Instructions  Check blood sugars 1-2 x day before breakfast and/or 2 hrs after one meal every day Exercise: Continue program for  40  minutes 7 days a week Eat 3 meals day,   2-3  snacks a day Space meals 4-6 hours apart Allow 2-3 hours between meals and snacks Carry fast acting glucose and a snack at all times Return for appointment: call for any questions  Expected Outcomes:  Demonstrated interest in learning. Expect positive outcomes  Education material provided:  Planning a Balanced Meal Quick and Healthy Meals for Diabetes Food Label Apps and Resources Diabetes Friendly Meals and Recipes Nutrition Prescription  If problems or questions, patient to contact team via:  Johny Drilling, RN, CCM, CDE [phone]  Future DSME appointment: - PRN  She completed 2 Hour Refresher Program

## 2018-09-29 NOTE — Patient Instructions (Signed)
Check blood sugars 1-2 x day before breakfast and/or 2 hrs after one meal every day  Exercise: Continue program for  40  minutes 7 days a week  Eat 3 meals day,   2-3  snacks a day Space meals 4-6 hours apart Allow 2-3 hours between meals and snacks  Carry fast acting glucose and a snack at all times  Return for appointment: call for any questions

## 2018-10-15 ENCOUNTER — Other Ambulatory Visit: Payer: Self-pay

## 2018-10-15 ENCOUNTER — Encounter: Payer: Self-pay | Admitting: Podiatry

## 2018-10-15 ENCOUNTER — Ambulatory Visit (INDEPENDENT_AMBULATORY_CARE_PROVIDER_SITE_OTHER): Payer: Medicare Other | Admitting: Podiatry

## 2018-10-15 ENCOUNTER — Ambulatory Visit: Payer: Medicare Other | Admitting: Podiatry

## 2018-10-15 VITALS — Temp 97.7°F

## 2018-10-15 DIAGNOSIS — M722 Plantar fascial fibromatosis: Secondary | ICD-10-CM | POA: Diagnosis not present

## 2018-10-15 NOTE — Progress Notes (Signed)
She presents today for follow-up of her plantar fasciitis to her left foot.  States that it was doing better but now is starting to become painful again about a week ago  Objective: Vital signs are stable alert and oriented x3.  Pulses are palpable.  Pain on palpation medial [place] tubercle of the left heel.  Assessment: Chronic intractable plantar fasciitis left.  Plan: Injection medial longitudinal arch plantar fascial calcaneal insertion site 20 mg Kenalog 5 mg Marcaine.

## 2018-11-12 ENCOUNTER — Ambulatory Visit: Payer: Medicare Other | Admitting: Podiatry

## 2019-02-10 IMAGING — CT CT NECK WO/W CM
2 of 4 series · 5 of 14 positions shown, 6 images · IV contrast (iopamidol)
Comparison: 07/03/2011

CLINICAL DATA: Right submandibular gland swelling and pain. Pain
extends down the right neck. Duration of symptoms 2 months.

EXAM:
CT NECK WITH AND WITHOUT CONTRAST
TECHNIQUE: Multidetector CT imaging of the neck was performed without and with
intravenous contrast.
CONTRAST:  75mL 3COO5A-JXX IOPAMIDOL (3COO5A-JXX) INJECTION 61%

[Series 4: axial neck · axial · 0.46mm/px · z∈[-226,-144]mm · 2 of 124 slices shown]
[im 42/124  bone]
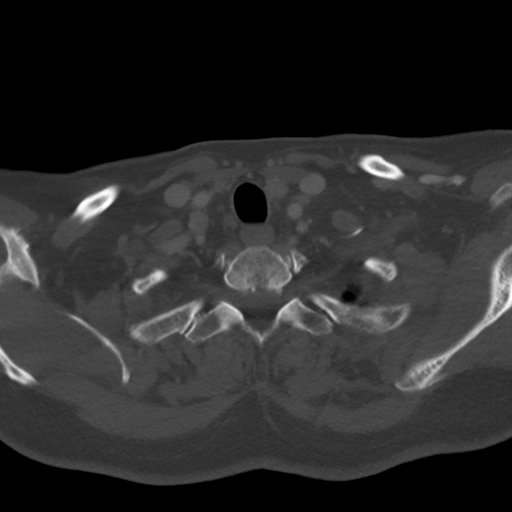
[im 83/124  bone]
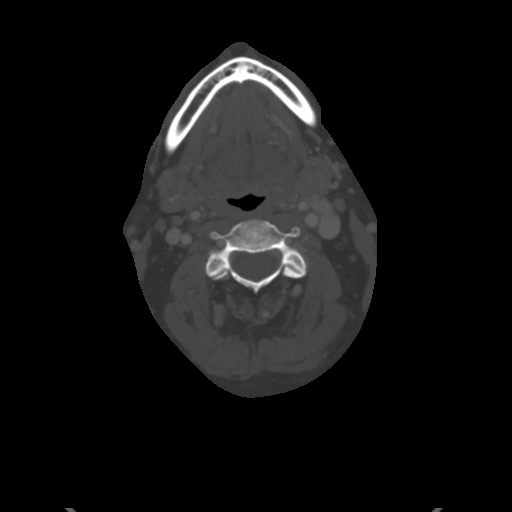

[Series 10: orthogonal ax · axial · 0.47mm/px · z∈[-267,-131]mm · 3 of 138 slices shown, 4 images]
[im 35/138  soft-tissue]
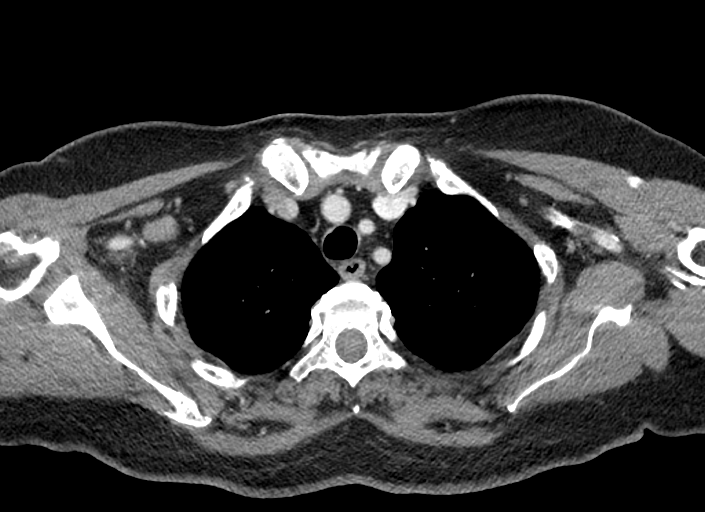
[im 35/138  bone]
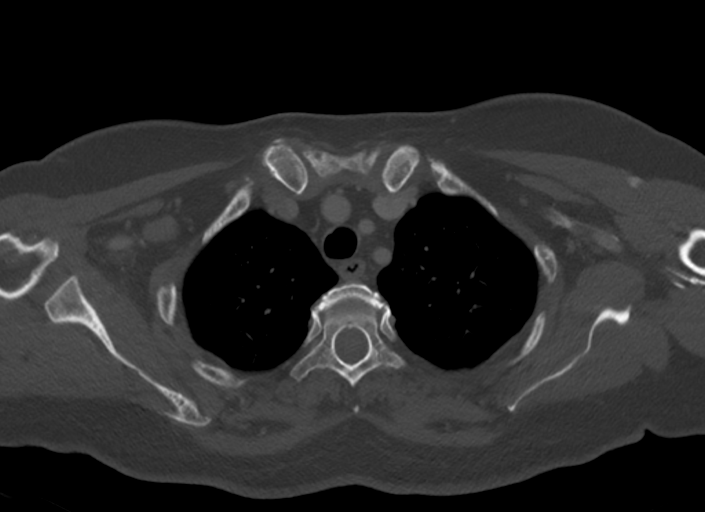
[im 69/138  bone]
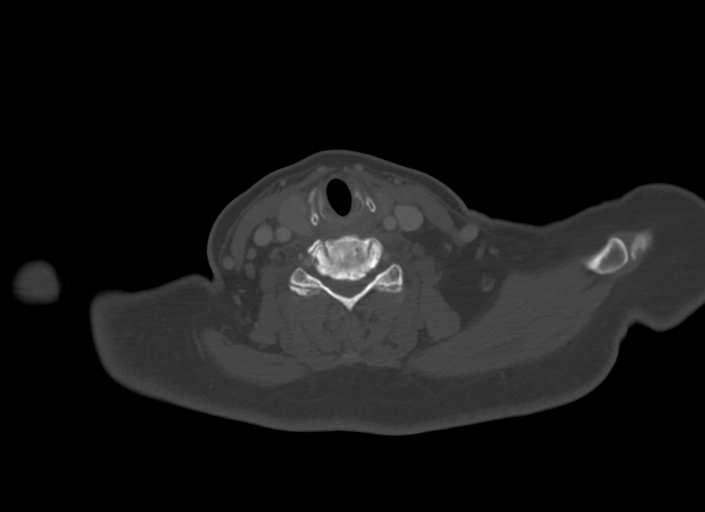
[im 103/138  bone]
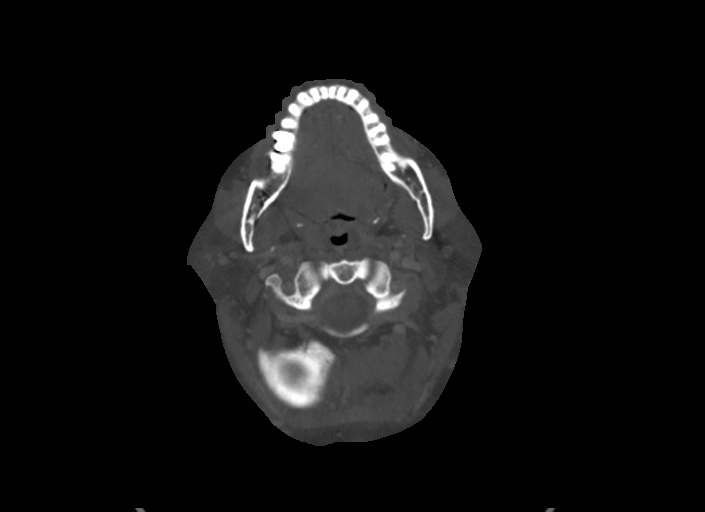

[5 of 14 positions shown; findings below may reference images not displayed]

FINDINGS: Pharynx and larynx: No mucosal or submucosal lesions seen.

Salivary glands: Parotid glands are symmetric and normal, fairly
fatty but without focal lesion. Submandibular glands appear normal
without asymmetry. No sign of inflammation, mass or stone.

Thyroid: Multinodular thyroid enlargement as was seen in 2159
consistent with benign goiter. The gland is overall slightly larger
than was seen 5 years ago.

Lymph nodes: No enlarged or low-density nodes on either side of the
neck. This includes the submandibular regions.

Vascular: Mild carotid bifurcation atherosclerosis without stenosis
or apparent irregularity. Otherwise negative.

Limited intracranial: Normal

Visualized orbits: Limited common normal

Mastoids and visualized paranasal sinuses: Clear

Skeleton: Cervical spondylosis, quite pronounced at C5-6 with
prominent osteophytes. Benign appearing sclerotic focus within the
T3 vertebral body.

Upper chest: Negative

Other: None
IMPRESSION: No pathologic finding by CT in the region of the submandibular
gland. The glands are symmetric. See above discussion.

Enlarged multinodular thyroid most consistent with goiter. This has
progressed somewhat since [DATE].

## 2019-03-10 ENCOUNTER — Other Ambulatory Visit: Payer: Self-pay | Admitting: Family Medicine

## 2019-03-10 DIAGNOSIS — M67912 Unspecified disorder of synovium and tendon, left shoulder: Secondary | ICD-10-CM

## 2019-03-24 ENCOUNTER — Ambulatory Visit: Payer: Medicare Other

## 2019-04-01 ENCOUNTER — Ambulatory Visit
Admission: RE | Admit: 2019-04-01 | Discharge: 2019-04-01 | Disposition: A | Discharge status: Home / Self Care | Payer: Medicare PPO | Source: Ambulatory Visit | Attending: Family Medicine | Admitting: Family Medicine

## 2019-04-01 ENCOUNTER — Other Ambulatory Visit: Payer: Self-pay

## 2019-04-01 DIAGNOSIS — M67912 Unspecified disorder of synovium and tendon, left shoulder: Secondary | ICD-10-CM | POA: Diagnosis present

## 2019-05-01 ENCOUNTER — Ambulatory Visit: Payer: Medicare PPO | Attending: Internal Medicine

## 2019-05-01 DIAGNOSIS — Z23 Encounter for immunization: Secondary | ICD-10-CM

## 2019-05-01 NOTE — Progress Notes (Signed)
   Covid-19 Vaccination Clinic  Name:  Nili Warmkessel    MRN: [id number] DOB: 1951-02-26  05/01/2019  Ms. Steinhardt was observed post Covid-19 immunization for 15 minutes without incidence. She was provided with Vaccine Information Sheet and instruction to access the V-Safe system.   Ms. Heckstall was instructed to call 911 with any severe reactions post vaccine: Marland Kitchen Difficulty breathing  . Swelling of your face and throat  . A fast heartbeat  . A bad rash all over your body  . Dizziness and weakness    Immunizations Administered    Name Date Dose VIS Date Route   Moderna COVID-19 Vaccine 05/01/2019  1:38 PM 0.5 mL 02/24/2019 Intramuscular   Manufacturer: Moderna   Lot: [id number]   [id number]

## 2019-06-02 ENCOUNTER — Ambulatory Visit: Payer: Medicare PPO | Attending: Internal Medicine

## 2019-06-02 DIAGNOSIS — Z23 Encounter for immunization: Secondary | ICD-10-CM | POA: Insufficient documentation

## 2019-06-02 NOTE — Progress Notes (Signed)
   Covid-19 Vaccination Clinic  Name:  Pamela Nash    MRN: [id number] DOB: Feb 25, 1951  06/02/2019  [name] was observed post Covid-19 immunization for 15 minutes without incident. She was provided with Vaccine Information Sheet and instruction to access the V-Safe system.   [name] was instructed to call 911 with any severe reactions post vaccine: Marland Kitchen Difficulty breathing  . Swelling of face and throat  . A fast heartbeat  . A bad rash all over body  . Dizziness and weakness   Immunizations Administered    Name Date Dose VIS Date Route   Moderna COVID-19 Vaccine 06/02/2019  1:26 PM 0.5 mL 02/24/2019 Intramuscular   Manufacturer: Moderna   Lot: [id number]   [id number]

## 2019-09-01 ENCOUNTER — Other Ambulatory Visit: Payer: Self-pay | Admitting: Family Medicine

## 2019-09-01 DIAGNOSIS — Z1231 Encounter for screening mammogram for malignant neoplasm of breast: Secondary | ICD-10-CM

## 2019-09-23 ENCOUNTER — Ambulatory Visit
Admission: RE | Admit: 2019-09-23 | Discharge: 2019-09-23 | Disposition: A | Discharge status: Home / Self Care | Payer: Medicare PPO | Source: Ambulatory Visit | Attending: Family Medicine | Admitting: Family Medicine

## 2019-09-23 ENCOUNTER — Other Ambulatory Visit: Payer: Self-pay

## 2019-09-23 DIAGNOSIS — Z1231 Encounter for screening mammogram for malignant neoplasm of breast: Secondary | ICD-10-CM | POA: Diagnosis present

## 2019-11-05 ENCOUNTER — Emergency Department: Payer: Medicare PPO

## 2019-11-05 ENCOUNTER — Encounter: Payer: Self-pay | Admitting: Emergency Medicine

## 2019-11-05 ENCOUNTER — Other Ambulatory Visit: Payer: Self-pay

## 2019-11-05 ENCOUNTER — Emergency Department
Admission: EM | Admit: 2019-11-05 | Discharge: 2019-11-05 | Disposition: A | Discharge status: Home / Self Care | Payer: Medicare PPO | Attending: Emergency Medicine | Admitting: Emergency Medicine

## 2019-11-05 DIAGNOSIS — Z5321 Procedure and treatment not carried out due to patient leaving prior to being seen by health care provider: Secondary | ICD-10-CM | POA: Insufficient documentation

## 2019-11-05 DIAGNOSIS — R0789 Other chest pain: Secondary | ICD-10-CM | POA: Diagnosis present

## 2019-11-05 LAB — COMPREHENSIVE METABOLIC PANEL
ALT: 14 U/L (ref 0–44)
AST: 20 U/L (ref 15–41)
Albumin: 4 g/dL (ref 3.5–5.0)
Alkaline Phosphatase: 63 U/L (ref 38–126)
Anion gap: 14 (ref 5–15)
BUN: 21 mg/dL (ref 8–23)
CO2: 28 mmol/L (ref 22–32)
Calcium: 9 mg/dL (ref 8.9–10.3)
Chloride: 93 mmol/L — ABNORMAL LOW (ref 98–111)
Creatinine, Ser: 0.98 mg/dL (ref 0.44–1.00)
GFR calc Af Amer: >60 mL/min (ref >60)
GFR calc non Af Amer: 59 mL/min — ABNORMAL LOW (ref >60)
Glucose, Bld: 176 mg/dL — ABNORMAL HIGH (ref 70–99)
Potassium: 3.3 mmol/L — ABNORMAL LOW (ref 3.5–5.1)
Sodium: 135 mmol/L (ref 135–145)
Total Bilirubin: 1 mg/dL (ref 0.3–1.2)
Total Protein: 7.7 g/dL (ref 6.5–8.1)

## 2019-11-05 LAB — CBC WITH DIFFERENTIAL/PLATELET
Abs Immature Granulocytes: 0.02 10*3/uL (ref 0.00–0.07)
Basophils Absolute: 0 10*3/uL (ref 0.0–0.1)
Basophils Relative: 0 %
Eosinophils Absolute: 0.1 10*3/uL (ref 0.0–0.5)
Eosinophils Relative: 1 %
HCT: 39.3 % (ref 36.0–46.0)
Hemoglobin: 12.7 g/dL (ref 12.0–15.0)
Immature Granulocytes: 0 %
Lymphocytes Relative: 28 %
Lymphs Abs: 2 10*3/uL (ref 0.7–4.0)
MCH: 28.1 pg (ref 26.0–34.0)
MCHC: 32.3 g/dL (ref 30.0–36.0)
MCV: 86.9 fL (ref 80.0–100.0)
Monocytes Absolute: 0.5 10*3/uL (ref 0.1–1.0)
Monocytes Relative: 7 %
Neutro Abs: 4.4 10*3/uL (ref 1.7–7.7)
Neutrophils Relative %: 64 %
Platelets: 312 10*3/uL (ref 150–400)
RBC: 4.52 MIL/uL (ref 3.87–5.11)
RDW: 13.2 % (ref 11.5–15.5)
WBC: 7 10*3/uL (ref 4.0–10.5)
nRBC: 0 % (ref 0.0–0.2)

## 2019-11-05 LAB — TROPONIN I (HIGH SENSITIVITY): Troponin I (High Sensitivity): <2 ng/L (ref <18)

## 2019-11-05 NOTE — ED Triage Notes (Signed)
Patient ambulatory to triage with steady gait, without difficulty or distress noted; pt reports left sided CP, nonradiating since last night; denies accomp symptoms; st pain began after picking tomatoes

## 2020-08-23 ENCOUNTER — Other Ambulatory Visit: Payer: Self-pay | Admitting: Family Medicine

## 2020-08-23 DIAGNOSIS — Z1231 Encounter for screening mammogram for malignant neoplasm of breast: Secondary | ICD-10-CM

## 2020-09-27 ENCOUNTER — Ambulatory Visit
Admission: RE | Admit: 2020-09-27 | Discharge: 2020-09-27 | Disposition: A | Discharge status: Home / Self Care | Payer: Medicare PPO | Source: Ambulatory Visit | Attending: Family Medicine | Admitting: Family Medicine

## 2020-09-27 ENCOUNTER — Other Ambulatory Visit: Payer: Self-pay

## 2020-09-27 DIAGNOSIS — Z1231 Encounter for screening mammogram for malignant neoplasm of breast: Secondary | ICD-10-CM | POA: Diagnosis present

## 2020-11-23 ENCOUNTER — Other Ambulatory Visit: Payer: Self-pay | Admitting: Obstetrics

## 2020-11-23 DIAGNOSIS — N6325 Unspecified lump in the left breast, overlapping quadrants: Secondary | ICD-10-CM

## 2020-11-25 ENCOUNTER — Ambulatory Visit
Admission: RE | Admit: 2020-11-25 | Discharge: 2020-11-25 | Disposition: A | Discharge status: Home / Self Care | Payer: Medicare PPO | Source: Ambulatory Visit | Attending: Obstetrics | Admitting: Obstetrics

## 2020-11-25 ENCOUNTER — Other Ambulatory Visit: Payer: Self-pay

## 2020-11-25 DIAGNOSIS — N6325 Unspecified lump in the left breast, overlapping quadrants: Secondary | ICD-10-CM

## 2021-04-14 ENCOUNTER — Ambulatory Visit: Admission: RE | Admit: 2021-04-14 | Payer: Medicare PPO | Source: Home / Self Care

## 2021-04-14 ENCOUNTER — Encounter: Admission: RE | Payer: Self-pay | Source: Home / Self Care

## 2021-04-14 SURGERY — ESOPHAGOGASTRODUODENOSCOPY (EGD) WITH PROPOFOL
Anesthesia: General

## 2021-07-04 ENCOUNTER — Encounter
Admission: RE | Disposition: A | Discharge status: Home / Self Care | Payer: Self-pay | Source: Home / Self Care | Attending: Gastroenterology

## 2021-07-04 ENCOUNTER — Encounter: Payer: Self-pay | Admitting: *Deleted

## 2021-07-04 ENCOUNTER — Ambulatory Visit: Payer: Medicare PPO | Admitting: Anesthesiology

## 2021-07-04 ENCOUNTER — Ambulatory Visit
Admission: RE | Admit: 2021-07-04 | Discharge: 2021-07-04 | Disposition: A | Discharge status: Home / Self Care | Payer: Medicare PPO | Attending: Gastroenterology | Admitting: Gastroenterology

## 2021-07-04 DIAGNOSIS — Z6829 Body mass index (BMI) 29.0-29.9, adult: Secondary | ICD-10-CM | POA: Diagnosis not present

## 2021-07-04 DIAGNOSIS — E7800 Pure hypercholesterolemia, unspecified: Secondary | ICD-10-CM | POA: Insufficient documentation

## 2021-07-04 DIAGNOSIS — I10 Essential (primary) hypertension: Secondary | ICD-10-CM | POA: Insufficient documentation

## 2021-07-04 DIAGNOSIS — Z8673 Personal history of transient ischemic attack (TIA), and cerebral infarction without residual deficits: Secondary | ICD-10-CM | POA: Insufficient documentation

## 2021-07-04 DIAGNOSIS — K219 Gastro-esophageal reflux disease without esophagitis: Secondary | ICD-10-CM | POA: Insufficient documentation

## 2021-07-04 DIAGNOSIS — K449 Diaphragmatic hernia without obstruction or gangrene: Secondary | ICD-10-CM | POA: Insufficient documentation

## 2021-07-04 DIAGNOSIS — E119 Type 2 diabetes mellitus without complications: Secondary | ICD-10-CM | POA: Diagnosis not present

## 2021-07-04 DIAGNOSIS — E669 Obesity, unspecified: Secondary | ICD-10-CM | POA: Insufficient documentation

## 2021-07-04 DIAGNOSIS — I4892 Unspecified atrial flutter: Secondary | ICD-10-CM | POA: Diagnosis not present

## 2021-07-04 DIAGNOSIS — I6529 Occlusion and stenosis of unspecified carotid artery: Secondary | ICD-10-CM | POA: Diagnosis not present

## 2021-07-04 DIAGNOSIS — M1990 Unspecified osteoarthritis, unspecified site: Secondary | ICD-10-CM | POA: Insufficient documentation

## 2021-07-04 DIAGNOSIS — G4730 Sleep apnea, unspecified: Secondary | ICD-10-CM | POA: Insufficient documentation

## 2021-07-04 HISTORY — DX: Age-related osteoporosis without current pathological fracture: M81.0

## 2021-07-04 HISTORY — DX: Unspecified cataract: H26.9

## 2021-07-04 HISTORY — DX: Headache, unspecified: R51.9

## 2021-07-04 HISTORY — PX: ESOPHAGOGASTRODUODENOSCOPY (EGD) WITH PROPOFOL: SHX5813

## 2021-07-04 HISTORY — DX: Transient cerebral ischemic attack, unspecified: G45.9

## 2021-07-04 HISTORY — DX: Occlusion and stenosis of unspecified carotid artery: I65.29

## 2021-07-04 HISTORY — DX: Deficiency of other specified B group vitamins: E53.8

## 2021-07-04 HISTORY — DX: Palpitations: R00.2

## 2021-07-04 LAB — GLUCOSE, CAPILLARY: Glucose-Capillary: 116 mg/dL — ABNORMAL HIGH (ref 70–99)

## 2021-07-04 SURGERY — ESOPHAGOGASTRODUODENOSCOPY (EGD) WITH PROPOFOL
Anesthesia: General

## 2021-07-04 MED ORDER — LIDOCAINE HCL (CARDIAC) PF 100 MG/5ML IV SOSY
PREFILLED_SYRINGE | INTRAVENOUS | Status: DC | PRN
  Administered 2021-07-04: 50 mg via INTRAVENOUS

## 2021-07-04 MED ORDER — PROPOFOL 10 MG/ML IV BOLUS
INTRAVENOUS | Status: AC
  Filled 2021-07-04: qty 20

## 2021-07-04 MED ORDER — PROPOFOL 10 MG/ML IV BOLUS
INTRAVENOUS | Status: DC | PRN
  Administered 2021-07-04: 20 mg via INTRAVENOUS
  Administered 2021-07-04: 70 mg via INTRAVENOUS

## 2021-07-04 MED ORDER — SODIUM CHLORIDE 0.9 % IV SOLN
INTRAVENOUS | Status: DC
Start: 2021-07-04 — End: 2021-07-04
  Administered 2021-07-04: 1000 mL via INTRAVENOUS

## 2021-07-04 NOTE — Anesthesia Preprocedure Evaluation (Addendum)
Anesthesia Evaluation  ?Patient identified by MRN, date of birth, ID band ?Patient awake ? ? ? ?Reviewed: ?Allergy & Precautions, H&P , NPO status , Patient's Chart, lab work & pertinent test results, reviewed documented beta blocker date and time  ? ?History of Anesthesia Complications ?(+) PONV and history of anesthetic complications ? ?Airway ?Mallampati: III ? ?TM Distance: <3 FB ?Neck ROM: limited ? ? ? Dental ?no notable dental hx. ? ?  ?Pulmonary ?neg shortness of breath, sleep apnea and Continuous Positive Airway Pressure Ventilation ,  ?  ?Pulmonary exam normal ? ? ? ? ? ? ? Cardiovascular ?Exercise Tolerance: Good ?hypertension, Pt. on home beta blockers ?(-) angina(-) Past MI and (-) DOE Normal cardiovascular exam+ dysrhythmias  ? ? ?  ?Neuro/Psych ?TIA (Carotid artery stenosis)negative psych ROS  ? GI/Hepatic ?Neg liver ROS, GERD  Medicated and Controlled,  ?Endo/Other  ?diabetes, Type 2, Oral Hypoglycemic Agents ? Renal/GU ?negative Renal ROS  ?negative genitourinary ?  ?Musculoskeletal ? ?(+) Arthritis ,  ? Abdominal ?Normal abdominal exam  (+)   ?Peds ? Hematology ?negative hematology ROS ?(+)   ?Anesthesia Other Findings ?Past Medical History: ?No date: Arthritis ?No date: Atrial flutter ([place]) ?    Comment:  c-pap dependent ?No date: Breast screening, unspecified ?No date: Complication of anesthesia ?No date: CPAP (continuous positive airway pressure) dependence ?    Comment:  FOR PAC'S ?No date: Diabetes [facility]) ?    Comment:  non-insulin dependent ?No date: Diffuse cystic mastopathy ?No date: Dysrhythmia ?    Comment:  PAC'S ?No date: Edema ?    Comment:  ANKLES ?No date: Family history of malignant neoplasm of breast ?No date: GERD (gastroesophageal reflux disease) ?No date: High cholesterol ?No date: Obesity, unspecified ?No date: Personal history of colonic polyps ?No date: PONV (postoperative nausea and vomiting) ?No date: Sleep apnea ?No date: Special screening  for malignant neoplasms, colon ?No date: Unspecified essential hypertension ? ?Past Surgical History: ?1998: ABDOMINAL HYSTERECTOMY ?2016: BREAST BIOPSY; Right ?    Comment:  W/CLIP - NEG ?1991: BREAST EXCISIONAL BIOPSY; Right ?    Comment:  NEG ?1995: BREAST EXCISIONAL BIOPSY; Right ?    Comment:  NEG ?11/13/2016: CATARACT EXTRACTION W/PHACO; Right ?    Comment:  Procedure: CATARACT EXTRACTION PHACO AND INTRAOCULAR  ?             LENS PLACEMENT ([place]);  Surgeon: Birder Robson, MD;   ?             Location: [facility] ORS;  Service: Ophthalmology;  Laterality: ?             Right;  [place] 00:41.8 ?AP% 24.3 ?CDE 10.13 ?Fluid pack lot # ?             [id number] H ?12/04/2016: CATARACT EXTRACTION W/PHACO; Left ?    Comment:  Procedure: CATARACT EXTRACTION PHACO AND INTRAOCULAR  ?             LENS PLACEMENT ([place]);  Surgeon: Birder Robson, MD;   ?             Location: [facility] ORS;  Service: Ophthalmology;  Laterality: ?             Left;  [place] 00:39 ?AP% 16.2 ?CDE 6.34 ?fluid pack lot #  ?             [id number] H ?2010: COLONOSCOPY ?No date: COLONOSCOPY ?    Comment:  x3 last 01/23/2013 ?No date: DILATION AND CURETTAGE OF UTERUS ?1978:  LAPAROSCOPY ?1978: PILONIDAL CYST / SINUS EXCISION ?1983: TUBAL LIGATION ? ?BMI   ? Body Mass Index:  27.46 kg/m?  ?  ? ? Reproductive/Obstetrics ?negative OB ROS ? ?  ? ? ? ? ? ? ? ? ? ? ? ? ? ?  ?  ? ? ? ? ? ? ? ?Anesthesia Physical ? ?Anesthesia Plan ? ?ASA: III ? ?Anesthesia Plan: General  ? ?Post-op Pain Management:   ? ?Induction: Intravenous ? ?PONV Risk Score and Plan: Propofol infusion and TIVA ? ?Airway Management Planned: Natural Airway and Nasal Cannula ? ?Additional Equipment:  ? ?Intra-op Plan:  ? ?Post-operative Plan:  ? ?Informed Consent: I have reviewed the patients History and Physical, chart, labs and discussed the procedure including the risks, benefits and alternatives for the proposed anesthesia with the patient or authorized representative who has indicated his/her understanding and  acceptance.  ? ? ? ?Dental advisory given ? ?Plan Discussed with: Anesthesiologist, CRNA and Surgeon ? ?Anesthesia Plan Comments: (Patient consented for risks of anesthesia including but not limited to:  ?- adverse reactions to medications ?- risk of intubation if required ?- damage to teeth, lips or other oral mucosa ?- sore throat or hoarseness ?- Damage to heart, brain, lungs or loss of life ? ?Patient voiced understanding.)  ? ? ? ? ? ?Anesthesia Quick Evaluation ? ?

## 2021-07-04 NOTE — Interval H&P Note (Signed)
History and Physical Interval Note: ? ?07/04/2021 ?9:34 AM ? ?Pamela Nash  has presented today for surgery, with the diagnosis of Esophagitis.  The various methods of treatment have been discussed with the patient and family. After consideration of risks, benefits and other options for treatment, the patient has consented to  Procedure(s) with comments: ?ESOPHAGOGASTRODUODENOSCOPY (EGD) WITH PROPOFOL (N/A) - DM as a surgical intervention.  The patient's history has been reviewed, patient examined, no change in status, stable for surgery.  I have reviewed the patient's chart and labs.  Questions were answered to the patient's satisfaction.   ? ? ?[name] [name] ? ?Ok to proceed with EGD ?

## 2021-07-04 NOTE — Op Note (Signed)
[facility] ?Gastroenterology ?Patient Name: Pamela Nash ?Procedure Date: 07/04/2021 9:28 AM ?MRN: [id number] ?Account #: [id number] ?Date of Birth: 01-Feb-1951 ?Admit Type: Outpatient ?Age: 71 ?Room: [facility] ENDO ROOM 1 ?Gender: Female ?Note Status: Finalized ?Instrument Name: Upper Endoscope [id number] ?Procedure:             Upper GI endoscopy ?Indications:           Gastro-esophageal reflux disease ?Providers:             Andrey Farmer MD, MD ?Referring MD:          Dion Body (Referring MD) ?Medicines:             Monitored Anesthesia Care ?Complications:         No immediate complications. ?Procedure:             Pre-Anesthesia Assessment: ?                       - Prior to the procedure, a History and Physical was  ?                       performed, and patient medications and allergies were  ?                       reviewed. The patient is competent. The risks and  ?                       benefits of the procedure and the sedation options and  ?                       risks were discussed with the patient. All questions  ?                       were answered and informed consent was obtained.  ?                       Patient identification and proposed procedure were  ?                       verified by the physician, the nurse, the  ?                       anesthesiologist, the anesthetist and the technician  ?                       in the endoscopy suite. Mental Status Examination:  ?                       alert and oriented. Airway Examination: normal  ?                       oropharyngeal airway and neck mobility. Respiratory  ?                       Examination: clear to auscultation. CV Examination:  ?                       normal. Prophylactic Antibiotics: The patient does not  ?  require prophylactic antibiotics. Prior  ?                       Anticoagulants: The patient has taken no previous  ?                       anticoagulant or antiplatelet agents. ASA  Grade  ?                       Assessment: II - A patient with mild systemic disease.  ?                       After reviewing the risks and benefits, the patient  ?                       was deemed in satisfactory condition to undergo the  ?                       procedure. The anesthesia plan was to use monitored  ?                       anesthesia care (MAC). Immediately prior to  ?                       administration of medications, the patient was  ?                       re-assessed for adequacy to receive sedatives. The  ?                       heart rate, respiratory rate, oxygen saturations,  ?                       blood pressure, adequacy of pulmonary ventilation, and  ?                       response to care were monitored throughout the  ?                       procedure. The physical status of the patient was  ?                       re-assessed after the procedure. ?                       After obtaining informed consent, the endoscope was  ?                       passed under direct vision. Throughout the procedure,  ?                       the patient's blood pressure, pulse, and oxygen  ?                       saturations were monitored continuously. The Endoscope  ?                       was introduced through the mouth, and advanced to the  ?  second part of duodenum. The upper GI endoscopy was  ?                       accomplished without difficulty. The patient tolerated  ?                       the procedure well. ?Findings: ?     The examined esophagus was normal. ?     A small hiatal hernia was present. ?     The entire examined stomach was normal. ?     The examined duodenum was normal. ?Impression:            - Normal esophagus. ?                       - Small hiatal hernia. ?                       - Normal stomach. ?                       - Normal examined duodenum. ?                       - No specimens collected. ?Recommendation:        - Discharge patient to  home. ?                       - Resume previous diet. ?                       - Continue present medications. ?                       - Return to referring physician as previously  ?                       scheduled. ?Procedure Code(s):     --- Professional --- ?                       [phone], Esophagogastroduodenoscopy, flexible,  ?                       transoral; diagnostic, including collection of  ?                       specimen(s) by brushing or washing, when performed  ?                       (separate procedure) ?Diagnosis Code(s):     --- Professional --- ?                       K44.9, Diaphragmatic hernia without obstruction or  ?                       gangrene ?                       K21.9, Gastro-esophageal reflux disease without  ?                       esophagitis ?CPT copyright 2019 American Medical Association. All rights reserved. ?  The codes documented in this report are preliminary and upon coder review may  ?be revised to meet current compliance requirements. ?Andrey Farmer MD, MD ?07/04/2021 9:48:40 AM ?Number of Addenda: 0 ?Note Initiated On: 07/04/2021 9:28 AM ?Estimated Blood Loss:  Estimated blood loss: none. ?     [facility] ?

## 2021-07-04 NOTE — Transfer of Care (Signed)
Immediate Anesthesia Transfer of Care Note ? ?Patient: Kayanna Mckillop Camilo ? ?Procedure(s) Performed: ESOPHAGOGASTRODUODENOSCOPY (EGD) WITH PROPOFOL ? ?Patient Location: PACU and Endoscopy Unit ? ?Anesthesia Type:General ? ?Level of Consciousness: drowsy and patient cooperative ? ?Airway & Oxygen Therapy: Patient Spontanous Breathing ? ?Post-op Assessment: Report given to RN and Post -op Vital signs reviewed and stable ? ?Post vital signs: Reviewed and stable ? ?Last Vitals:  ?Vitals Value Taken Time  ?BP 108/61 07/04/21 0947  ?Temp    ?Pulse 77 07/04/21 0950  ?Resp 22 07/04/21 0950  ?SpO2 97 % 07/04/21 0950  ?Vitals shown include unvalidated device data. ? ?Last Pain:  ?Vitals:  ? 07/04/21 0940  ?TempSrc: Temporal  ?   ? ?  ? ?Complications: No notable events documented. ?

## 2021-07-04 NOTE — H&P (Signed)
Outpatient short stay form Pre-procedure ?07/04/2021  ?Lesly Rubenstein, MD ? ?Primary Physician: Dion Body, MD ? ?Reason for visit:  GERD/Possible Barrett's esophagus ? ?History of present illness:   ? ?71 y/o lady with history of hypertension and DM II here for EGD due to possible BE on EGD done in 2019. GERD well controlled on PPI. No blood thinners. No neck surgeries. No family history of esophageal cancer. ? ? ? ?Current Facility-Administered Medications:  ?  0.9 %  sodium chloride infusion, , Intravenous, Continuous, Billiejean Schimek, [name], MD, Last Rate: 20 mL/hr at 07/04/21 0914, 1,000 mL at 07/04/21 0914 ? ?Medications Prior to Admission  ?Medication Sig Dispense Refill Last Dose  ? acetaminophen (TYLENOL) 500 MG tablet Take 1,000 mg by mouth 2 (two) times daily as needed for moderate pain or headache.   Past Week  ? aspirin EC 81 MG tablet Take 1 tablet (81 mg total) by mouth daily. 180 tablet 0 07/03/2021  ? glimepiride (AMARYL) 4 MG tablet Take 4 mg by mouth 2 (two) times a day.   07/03/2021  ? hydrochlorothiazide (HYDRODIURIL) 25 MG tablet Take 25 mg by mouth daily.   07/04/2021 at 0600  ? losartan (COZAAR) 100 MG tablet Take 100 mg by mouth daily.    07/04/2021 at 0600  ? Magnesium Oxide 400 MG CAPS 1 p.o. daily (Patient taking differently: Take 400 mg by mouth daily. 1 p.o. daily) 90 capsule 0 07/03/2021  ? metoprolol tartrate (LOPRESSOR) 25 MG tablet Take 25 mg by mouth 2 (two) times daily.    07/04/2021 at 0600  ? ONE TOUCH ULTRA TEST test strip    07/03/2021  ? ONETOUCH DELICA LANCETS 92E MISC    07/03/2021  ? vitamin B-12 (CYANOCOBALAMIN) 1000 MCG tablet Take 1 tablet (1,000 mcg total) by mouth daily. 90 tablet 0 07/03/2021  ? VITAMIN D PO Take 2,000 Units by mouth daily.   07/03/2021  ? empagliflozin (JARDIANCE) 25 MG TABS tablet Take 25 mg by mouth daily.  (Patient not taking: Reported on 07/04/2021)   Not Taking  ? fluticasone (FLONASE) 50 MCG/ACT nasal spray Place 2 sprays into the nose daily as  needed.     at prn  ? ibuprofen (ADVIL,MOTRIN) 200 MG tablet Take 400 mg by mouth 2 (two) times daily as needed for headache or moderate pain.    at prn  ? meloxicam (MOBIC) 15 MG tablet Take 1 tablet (15 mg total) by mouth daily. (Patient not taking: Reported on 07/04/2021) 30 tablet 3 Not Taking  ? pantoprazole (PROTONIX) 40 MG tablet Take 40 mg by mouth daily.      ? pioglitazone (ACTOS) 15 MG tablet Take 15 mg by mouth daily.      ? rosuvastatin (CRESTOR) 10 MG tablet Take 10 mg by mouth every evening.  (Patient not taking: Reported on 07/04/2021)   Not Taking  ? topiramate (TOPAMAX) 25 MG tablet Take 1 tablet (25 mg total) by mouth 2 (two) times daily as needed (Headache). (Patient not taking: Reported on 09/15/2018) 60 tablet 0   ? ? ? ?Allergies  ?Allergen Reactions  ? Cefuroxime Axetil Swelling  ?  Felt like throat was swelling; headaches  ? Lisinopril   ?  cough  ? Losartan Potassium   ?  Weight gain (hctz component negates the reaction)   ? Pioglitazone Nausea And Vomiting and Rash  ?  Med caused weight gain  ? Sulfa Antibiotics Hives and Rash  ? Sulfasalazine Hives and Rash  ? ? ? ?  Past Medical History:  ?Diagnosis Date  ? Arthritis   ? Atrial flutter ([place])   ? c-pap dependent  ? B12 deficiency   ? Breast screening, unspecified   ? Carotid artery stenosis   ? bilateral  ? Cataract of both eyes   ? Complication of anesthesia   ? CPAP (continuous positive airway pressure) dependence   ? FOR PAC'S  ? Diabetes ([place])   ? non-insulin dependent  ? Diffuse cystic mastopathy   ? Dysrhythmia   ? PAC'S  ? Edema   ? ANKLES  ? Family history of malignant neoplasm of breast   ? GERD (gastroesophageal reflux disease)   ? Headache   ? High cholesterol   ? Obesity, unspecified   ? Osteoporosis   ? Palpitations   ? Personal history of colonic polyps   ? PONV (postoperative nausea and vomiting)   ? Sleep apnea   ? Special screening for malignant neoplasms, colon   ? TIA (transient ischemic attack)   ? Unspecified essential  hypertension   ? ? ?Review of systems:  Otherwise negative.  ? ? ?Physical Exam ? ?Gen: Alert, oriented. Appears stated age.  ?HEENT: PERRLA. ?Lungs: No respiratory distress ?CV: RRR ?Abd: soft, benign, no masses ?Ext: No edema ? ? ? ?Planned procedures: Proceed with EGD. The patient understands the nature of the planned procedure, indications, risks, alternatives and potential complications including but not limited to bleeding, infection, perforation, damage to internal organs and possible oversedation/side effects from anesthesia. The patient agrees and gives consent to proceed.  ?Please refer to procedure notes for findings, recommendations and patient disposition/instructions.  ? ? ? ?Lesly Rubenstein, MD ?[facility] ? ? ? ?  ? ?

## 2021-07-04 NOTE — Anesthesia Postprocedure Evaluation (Signed)
Anesthesia Post Note ? ?Patient: Suttyn Cryder Wolke ? ?Procedure(s) Performed: ESOPHAGOGASTRODUODENOSCOPY (EGD) WITH PROPOFOL ? ?Patient location during evaluation: Endoscopy ?Anesthesia Type: General ?Level of consciousness: awake and alert ?Pain management: pain level controlled ?Vital Signs Assessment: post-procedure vital signs reviewed and stable ?Respiratory status: spontaneous breathing, nonlabored ventilation and respiratory function stable ?Cardiovascular status: blood pressure returned to baseline and stable ?Postop Assessment: no apparent nausea or vomiting ?Anesthetic complications: no ? ? ?No notable events documented. ? ? ?Last Vitals:  ?Vitals:  ? 07/04/21 1000 07/04/21 1010  ?BP: (!) 119/56 99/65  ?Pulse: 68 67  ?Resp: 16 16  ?Temp:    ?SpO2: 97% 98%  ?  ?Last Pain:  ?Vitals:  ? 07/04/21 0940  ?TempSrc: Temporal  ? ? ?  ?  ?  ?  ?  ?  ? ?[place] Ouch ? ? ? ? ?

## 2021-07-05 ENCOUNTER — Encounter: Payer: Self-pay | Admitting: Gastroenterology

## 2021-08-31 ENCOUNTER — Other Ambulatory Visit: Payer: Self-pay | Admitting: Family Medicine

## 2021-08-31 DIAGNOSIS — Z1231 Encounter for screening mammogram for malignant neoplasm of breast: Secondary | ICD-10-CM

## 2021-10-04 ENCOUNTER — Ambulatory Visit
Admission: RE | Admit: 2021-10-04 | Discharge: 2021-10-04 | Disposition: A | Discharge status: Home / Self Care | Payer: Medicare PPO | Source: Ambulatory Visit | Attending: Family Medicine | Admitting: Family Medicine

## 2021-10-04 DIAGNOSIS — Z1231 Encounter for screening mammogram for malignant neoplasm of breast: Secondary | ICD-10-CM | POA: Diagnosis present

## 2022-09-03 ENCOUNTER — Other Ambulatory Visit: Payer: Self-pay | Admitting: Family Medicine

## 2022-09-03 DIAGNOSIS — Z1231 Encounter for screening mammogram for malignant neoplasm of breast: Secondary | ICD-10-CM

## 2022-10-08 ENCOUNTER — Ambulatory Visit
Admission: RE | Admit: 2022-10-08 | Discharge: 2022-10-08 | Disposition: A | Discharge status: Home / Self Care | Payer: Medicare PPO | Source: Ambulatory Visit | Attending: Family Medicine | Admitting: Family Medicine

## 2022-10-08 DIAGNOSIS — Z1231 Encounter for screening mammogram for malignant neoplasm of breast: Secondary | ICD-10-CM | POA: Diagnosis not present

## 2023-06-04 ENCOUNTER — Encounter: Payer: Self-pay | Admitting: *Deleted

## 2023-06-04 ENCOUNTER — Ambulatory Visit: Admit: 2023-06-04 | Payer: Medicare PPO

## 2023-06-04 SURGERY — COLONOSCOPY WITH PROPOFOL
Anesthesia: General

## 2023-06-11 ENCOUNTER — Encounter
Admission: RE | Disposition: A | Discharge status: Home / Self Care | Payer: Self-pay | Source: Home / Self Care | Attending: Gastroenterology

## 2023-06-11 ENCOUNTER — Ambulatory Visit
Admission: RE | Admit: 2023-06-11 | Discharge: 2023-06-11 | Disposition: A | Discharge status: Home / Self Care | Payer: Medicare PPO | Attending: Gastroenterology | Admitting: Gastroenterology

## 2023-06-11 ENCOUNTER — Ambulatory Visit

## 2023-06-11 ENCOUNTER — Other Ambulatory Visit: Payer: Self-pay

## 2023-06-11 ENCOUNTER — Encounter: Payer: Self-pay | Admitting: *Deleted

## 2023-06-11 DIAGNOSIS — Z7984 Long term (current) use of oral hypoglycemic drugs: Secondary | ICD-10-CM | POA: Insufficient documentation

## 2023-06-11 DIAGNOSIS — I10 Essential (primary) hypertension: Secondary | ICD-10-CM | POA: Insufficient documentation

## 2023-06-11 DIAGNOSIS — I4891 Unspecified atrial fibrillation: Secondary | ICD-10-CM | POA: Diagnosis not present

## 2023-06-11 DIAGNOSIS — K219 Gastro-esophageal reflux disease without esophagitis: Secondary | ICD-10-CM | POA: Insufficient documentation

## 2023-06-11 DIAGNOSIS — G4733 Obstructive sleep apnea (adult) (pediatric): Secondary | ICD-10-CM | POA: Insufficient documentation

## 2023-06-11 DIAGNOSIS — Z8673 Personal history of transient ischemic attack (TIA), and cerebral infarction without residual deficits: Secondary | ICD-10-CM | POA: Diagnosis not present

## 2023-06-11 DIAGNOSIS — K640 First degree hemorrhoids: Secondary | ICD-10-CM | POA: Diagnosis not present

## 2023-06-11 DIAGNOSIS — E119 Type 2 diabetes mellitus without complications: Secondary | ICD-10-CM | POA: Diagnosis not present

## 2023-06-11 DIAGNOSIS — Z860101 Personal history of adenomatous and serrated colon polyps: Secondary | ICD-10-CM | POA: Diagnosis present

## 2023-06-11 DIAGNOSIS — Z1211 Encounter for screening for malignant neoplasm of colon: Secondary | ICD-10-CM | POA: Insufficient documentation

## 2023-06-11 HISTORY — PX: COLONOSCOPY WITH PROPOFOL: SHX5780

## 2023-06-11 LAB — GLUCOSE, CAPILLARY: Glucose-Capillary: 115 mg/dL — ABNORMAL HIGH (ref 70–99)

## 2023-06-11 SURGERY — COLONOSCOPY WITH PROPOFOL
Anesthesia: General

## 2023-06-11 MED ORDER — LIDOCAINE HCL (PF) 2 % IJ SOLN
INTRAMUSCULAR | Status: AC
Start: 2023-06-11 — End: ?
  Filled 2023-06-11: qty 5

## 2023-06-11 MED ORDER — PROPOFOL 10 MG/ML IV BOLUS
INTRAVENOUS | Status: AC
  Filled 2023-06-11: qty 20

## 2023-06-11 MED ORDER — PROPOFOL 10 MG/ML IV BOLUS
INTRAVENOUS | Status: AC
  Filled 2023-06-11: qty 40

## 2023-06-11 MED ORDER — PHENYLEPHRINE 80 MCG/ML (10ML) SYRINGE FOR IV PUSH (FOR BLOOD PRESSURE SUPPORT)
PREFILLED_SYRINGE | INTRAVENOUS | Status: AC
Start: 2023-06-11 — End: ?
  Filled 2023-06-11: qty 10

## 2023-06-11 MED ORDER — PROPOFOL 500 MG/50ML IV EMUL
INTRAVENOUS | Status: DC | PRN
  Administered 2023-06-11: 80 mg via INTRAVENOUS
  Administered 2023-06-11: 150 ug/kg/min via INTRAVENOUS

## 2023-06-11 MED ORDER — SODIUM CHLORIDE 0.9 % IV SOLN: INTRAVENOUS | Status: DC

## 2023-06-11 NOTE — Interval H&P Note (Signed)
 History and Physical Interval Note:  06/11/2023 9:14 AM  Pamela Nash  has presented today for surgery, with the diagnosis of History of adenomatous polyp of colon.  The various methods of treatment have been discussed with the patient and family. After consideration of risks, benefits and other options for treatment, the patient has consented to  Procedure(s): COLONOSCOPY WITH PROPOFOL (N/A) as a surgical intervention.  The patient's history has been reviewed, patient examined, no change in status, stable for surgery.  I have reviewed the patient's chart and labs.  Questions were answered to the patient's satisfaction.     Regis Bill  Ok to proceed with colonoscopy

## 2023-06-11 NOTE — Op Note (Signed)
 [facility] Patient Name: Pamela Nash Procedure Date: 06/11/2023 9:06 AM MRN: [id number] Account #: [id number] Date of Birth: 11/26/1950 Admit Type: Outpatient Age: 73 Room: [facility] ENDO ROOM 1 Gender: Female Note Status: Finalized Instrument Name: Nelda Marseille [id number] Procedure:             Colonoscopy Indications:           Surveillance: Personal history of adenomatous polyps                         on last colonoscopy 5 years ago Providers:             Eather Colas MD, MD Referring MD:          Marisue Ivan (Referring MD) Medicines:             Monitored Anesthesia Care Complications:         No immediate complications. Procedure:             Pre-Anesthesia Assessment:                        - Prior to the procedure, a History and Physical was                         performed, and patient medications and allergies were                         reviewed. The patient is competent. The risks and                         benefits of the procedure and the sedation options and                         risks were discussed with the patient. All questions                         were answered and informed consent was obtained.                         Patient identification and proposed procedure were                         verified by the physician, the nurse, the                         anesthesiologist, the anesthetist and the technician                         in the endoscopy suite. Mental Status Examination:                         alert and oriented. Airway Examination: normal                         oropharyngeal airway and neck mobility. Respiratory                         Examination: clear to auscultation. CV Examination:  normal. Prophylactic Antibiotics: The patient does not                         require prophylactic antibiotics. Prior                         Anticoagulants: The patient has taken no  anticoagulant                         or antiplatelet agents. ASA Grade Assessment: III - A                         patient with severe systemic disease. After reviewing                         the risks and benefits, the patient was deemed in                         satisfactory condition to undergo the procedure. The                         anesthesia plan was to use monitored anesthesia care                         (MAC). Immediately prior to administration of                         medications, the patient was re-assessed for adequacy                         to receive sedatives. The heart rate, respiratory                         rate, oxygen saturations, blood pressure, adequacy of                         pulmonary ventilation, and response to care were                         monitored throughout the procedure. The physical                         status of the patient was re-assessed after the                         procedure.                        After obtaining informed consent, the colonoscope was                         passed under direct vision. Throughout the procedure,                         the patient's blood pressure, pulse, and oxygen                         saturations were monitored continuously. The  Colonoscope was introduced through the anus and                         advanced to the the cecum, identified by appendiceal                         orifice and ileocecal valve. The colonoscopy was                         performed without difficulty. The patient tolerated                         the procedure well. The quality of the bowel                         preparation was good. The ileocecal valve, appendiceal                         orifice, and rectum were photographed. Findings:      The perianal and digital rectal examinations were normal.      The terminal ileum appeared normal.      Internal hemorrhoids were found during  retroflexion. The hemorrhoids       were Grade I (internal hemorrhoids that do not prolapse).      The exam was otherwise without abnormality on direct and retroflexion       views. Impression:            - The examined portion of the ileum was normal.                        - Internal hemorrhoids.                        - The examination was otherwise normal on direct and                         retroflexion views.                        - No specimens collected. Recommendation:        - Discharge patient to home.                        - Resume previous diet.                        - Continue present medications.                        - Repeat colonoscopy in 10 years for surveillance.                        - Return to referring physician as previously                         scheduled. Procedure Code(s):     --- Professional ---                        G2952, Colorectal cancer screening; colonoscopy on  individual at high risk Diagnosis Code(s):     --- Professional ---                        Z86.010, Personal history of colonic polyps                        K64.0, First degree hemorrhoids CPT copyright 2022 American [employer] Association. All rights reserved. The codes documented in this report are preliminary and upon coder review may  be revised to meet current compliance requirements. Eather Colas MD, MD 06/11/2023 9:45:30 AM Number of Addenda: 0 Note Initiated On: 06/11/2023 9:06 AM Scope Withdrawal Time: 0 hours 9 minutes 6 seconds  Total Procedure Duration: 0 hours 13 minutes 54 seconds  Estimated Blood Loss:  Estimated blood loss: none.      [facility]

## 2023-06-11 NOTE — Anesthesia Postprocedure Evaluation (Signed)
 Anesthesia Post Note  Patient: Pamela Nash  Procedure(s) Performed: COLONOSCOPY WITH PROPOFOL  Patient location during evaluation: Endoscopy Anesthesia Type: General Level of consciousness: awake and alert Pain management: pain level controlled Vital Signs Assessment: post-procedure vital signs reviewed and stable Respiratory status: spontaneous breathing, nonlabored ventilation, respiratory function stable and patient connected to nasal cannula oxygen Cardiovascular status: blood pressure returned to baseline and stable Postop Assessment: no apparent nausea or vomiting Anesthetic complications: no   There were no known notable events for this encounter.   Last Vitals:  Vitals:   06/11/23 0953 06/11/23 1001  BP: (!) 105/47 (!) 101/51  Pulse:    Resp:    Temp:    SpO2:      Last Pain:  Vitals:   06/11/23 1001  TempSrc:   PainSc: 0-No pain                 [name]

## 2023-06-11 NOTE — Anesthesia Preprocedure Evaluation (Signed)
 Anesthesia Evaluation  Patient identified by MRN, date of birth, ID band Patient awake    Reviewed: Allergy & Precautions, NPO status , Patient's Chart, lab work & pertinent test results  History of Anesthesia Complications (+) PONV and history of anesthetic complications  Airway Mallampati: III  TM Distance: <3 FB Neck ROM: full    Dental  (+) Chipped   Pulmonary neg shortness of breath, sleep apnea    Pulmonary exam normal        Cardiovascular Exercise Tolerance: Good hypertension, (-) angina + dysrhythmias Atrial Fibrillation      Neuro/Psych  Headaches TIA negative psych ROS   GI/Hepatic Neg liver ROS,GERD  Controlled,,  Endo/Other  diabetes, Type 2    Renal/GU negative Renal ROS  negative genitourinary   Musculoskeletal   Abdominal   Peds  Hematology negative hematology ROS (+)   Anesthesia Other Findings Past Medical History: No date: Arthritis No date: Atrial flutter (HCC)     Comment:  c-pap dependent No date: B12 deficiency No date: Breast screening, unspecified No date: Carotid artery stenosis     Comment:  bilateral No date: Cataract of both eyes No date: Complication of anesthesia No date: CPAP (continuous positive airway pressure) dependence     Comment:  FOR PAC'S No date: Diabetes (H[facility])     Comment:  non-insulin dependent No date: Diffuse cystic mastopathy No date: Dysrhythmia     Comment:  PAC'S No date: Edema     Comment:  ANKLES No date: Family history of malignant neoplasm of breast No date: GERD (gastroesophageal reflux disease) No date: Headache No date: High cholesterol No date: Obesity, unspecified No date: Osteoporosis No date: Palpitations No date: Personal history of colonic polyps No date: PONV (postoperative nausea and vomiting) No date: Sleep apnea No date: Special screening for malignant neoplasms, colon No date: TIA (transient ischemic attack) No date: Unspecified  essential hypertension  Past Surgical History: 1998: ABDOMINAL HYSTERECTOMY 2016: BREAST BIOPSY; Right     Comment:  W/CLIP - NEG 1991: BREAST EXCISIONAL BIOPSY; Right     Comment:  NEG 1995: BREAST EXCISIONAL BIOPSY; Right     Comment:  NEG 11/13/2016: CATARACT EXTRACTION W/PHACO; Right     Comment:  Procedure: CATARACT EXTRACTION PHACO AND INTRAOCULAR               LENS PLACEMENT (IOC);  Surgeon: Galen Manila, MD;                Location: [facility] ORS;  Service: Ophthalmology;  Laterality:              Right;  [place] 00:41.8 AP% 24.3 CDE 10.13 Fluid pack lot #              [id number] 12/04/2016: CATARACT EXTRACTION W/PHACO; Left     Comment:  Procedure: CATARACT EXTRACTION PHACO AND INTRAOCULAR               LENS PLACEMENT (IOC);  Surgeon: Galen Manila, MD;                Location: [facility] ORS;  Service: Ophthalmology;  Laterality:              Left;  [place] 00:39 AP% 16.2 CDE 6.34 fluid pack lot #               [id number] 2010: COLONOSCOPY No date: COLONOSCOPY     Comment:  x3 last 01/23/2013 12/16/2017: COLONOSCOPY WITH PROPOFOL; N/A     Comment:  Procedure: COLONOSCOPY  WITH PROPOFOL;  Surgeon:               Christena Deem, MD;  Location: [facility] ENDOSCOPY;                Service: Endoscopy;  Laterality: N/A; No date: DILATION AND CURETTAGE OF UTERUS 12/16/2017: ESOPHAGOGASTRODUODENOSCOPY; N/A     Comment:  Procedure: ESOPHAGOGASTRODUODENOSCOPY (EGD);  Surgeon:               Christena Deem, MD;  Location: [facility] ENDOSCOPY;                Service: Endoscopy;  Laterality: N/A; 07/04/2021: ESOPHAGOGASTRODUODENOSCOPY (EGD) WITH PROPOFOL; N/A     Comment:  Procedure: ESOPHAGOGASTRODUODENOSCOPY (EGD) WITH               PROPOFOL;  Surgeon: Regis Bill, MD;  Location:               [facility] ENDOSCOPY;  Service: Endoscopy;  Laterality: N/A;                DM No date: EYE SURGERY 1978: LAPAROSCOPY No date: OOPHORECTOMY     Comment:  i ovary left, ? side 1978: PILONIDAL CYST /  SINUS EXCISION 1983: TUBAL LIGATION  BMI    Body Mass Index: 25.69 kg/m      Reproductive/Obstetrics negative OB ROS                             Anesthesia Physical Anesthesia Plan  ASA: 3  Anesthesia Plan: General   Post-op Pain Management:    Induction: Intravenous  PONV Risk Score and Plan: Propofol infusion and TIVA  Airway Management Planned: Natural Airway and Nasal Cannula  Additional Equipment:   Intra-op Plan:   Post-operative Plan:   Informed Consent: I have reviewed the patients History and Physical, chart, labs and discussed the procedure including the risks, benefits and alternatives for the proposed anesthesia with the patient or authorized representative who has indicated his/her understanding and acceptance.     Dental Advisory Given  Plan Discussed with: Anesthesiologist, CRNA and Surgeon  Anesthesia Plan Comments: (Patient consented for risks of anesthesia including but not limited to:  - adverse reactions to medications - risk of airway placement if required - damage to eyes, teeth, lips or other oral mucosa - nerve damage due to positioning  - sore throat or hoarseness - Damage to heart, brain, nerves, lungs, other parts of body or loss of life  Patient voiced understanding and assent.)       Anesthesia Quick Evaluation

## 2023-06-11 NOTE — H&P (Signed)
 Outpatient short stay form Pre-procedure 06/11/2023  Pamela Bill, MD  Primary Physician: Marisue Ivan, MD  Reason for visit:  Surveillance colonoscopy  History of present illness:    73 y/o lady with history of hypertension, OSA, and DM II here for surveillance colonoscopy. Last colonoscopy in 2019 with two small Ta's. No blood thinners. No family history of GI malignancies. History of hysterectomy.    Current Facility-Administered Medications:    0.9 %  sodium chloride infusion, , Intravenous, Continuous, Char Feltman, Rossie Muskrat, MD  Medications Prior to Admission  Medication Sig Dispense Refill Last Dose/Taking   aspirin EC 81 MG tablet Take 1 tablet (81 mg total) by mouth daily. 180 tablet 0 Past Week   ezetimibe (ZETIA) 10 MG tablet Take 10 mg by mouth daily.   Past Week   hydrochlorothiazide (HYDRODIURIL) 25 MG tablet Take 25 mg by mouth daily.   06/11/2023 Morning   losartan (COZAAR) 100 MG tablet Take 100 mg by mouth daily.    06/11/2023 Morning   metoprolol tartrate (LOPRESSOR) 25 MG tablet Take 25 mg by mouth 2 (two) times daily.    06/11/2023 Morning   pantoprazole (PROTONIX) 40 MG tablet Take 40 mg by mouth daily.    06/10/2023 Evening   pravastatin (PRAVACHOL) 80 MG tablet Take 80 mg by mouth daily.   Past Week   Semaglutide (RYBELSUS) 7 MG TABS Take 7 mg by mouth daily.   06/07/2023   vitamin B-12 (CYANOCOBALAMIN) 1000 MCG tablet Take 1 tablet (1,000 mcg total) by mouth daily. 90 tablet 0 Past Week   VITAMIN D PO Take 2,000 Units by mouth daily.   Past Week   acetaminophen (TYLENOL) 500 MG tablet Take 1,000 mg by mouth 2 (two) times daily as needed for moderate pain or headache.      empagliflozin (JARDIANCE) 25 MG TABS tablet Take 25 mg by mouth daily.  (Patient not taking: Reported on 07/04/2021)      fluticasone (FLONASE) 50 MCG/ACT nasal spray Place 2 sprays into the nose daily as needed.       glimepiride (AMARYL) 4 MG tablet Take 4 mg by mouth 2 (two) times a day.       ibuprofen (ADVIL,MOTRIN) 200 MG tablet Take 400 mg by mouth 2 (two) times daily as needed for headache or moderate pain.      Magnesium Oxide 400 MG CAPS 1 p.o. daily (Patient taking differently: Take 400 mg by mouth daily. 1 p.o. daily) 90 capsule 0    meloxicam (MOBIC) 15 MG tablet Take 1 tablet (15 mg total) by mouth daily. (Patient not taking: Reported on 07/04/2021) 30 tablet 3    ONE TOUCH ULTRA TEST test strip       ONETOUCH DELICA LANCETS 33G MISC       pioglitazone (ACTOS) 15 MG tablet Take 15 mg by mouth daily.       rosuvastatin (CRESTOR) 10 MG tablet Take 10 mg by mouth every evening.  (Patient not taking: Reported on 07/04/2021)      topiramate (TOPAMAX) 25 MG tablet Take 1 tablet (25 mg total) by mouth 2 (two) times daily as needed (Headache). (Patient not taking: Reported on 09/15/2018) 60 tablet 0      Allergies  Allergen Reactions   Cefuroxime Axetil Swelling    Felt like throat was swelling; headaches   Lisinopril     cough   Losartan Potassium     Weight gain (hctz component negates the reaction)    Pioglitazone Nausea  And Vomiting and Rash    Med caused weight gain   Sulfa Antibiotics Hives and Rash   Sulfasalazine Hives and Rash     Past Medical History:  Diagnosis Date   Arthritis    Atrial flutter (HCC)    c-pap dependent   B12 deficiency    Breast screening, unspecified    Carotid artery stenosis    bilateral   Cataract of both eyes    Complication of anesthesia    CPAP (continuous positive airway pressure) dependence    FOR PAC'S   Diabetes (HCC)    non-insulin dependent   Diffuse cystic mastopathy    Dysrhythmia    PAC'S   Edema    ANKLES   Family history of malignant neoplasm of breast    GERD (gastroesophageal reflux disease)    Headache    High cholesterol    Obesity, unspecified    Osteoporosis    Palpitations    Personal history of colonic polyps    PONV (postoperative nausea and vomiting)    Sleep apnea    Special screening  for malignant neoplasms, colon    TIA (transient ischemic attack)    Unspecified essential hypertension     Review of systems:  Otherwise negative.    Physical Exam  Gen: Alert, oriented. Appears stated age.  HEENT: PERRLA. Lungs: No respiratory distress CV: RRR Abd: soft, benign, no masses Ext: No edema    Planned procedures: Proceed with colonoscopy. The patient understands the nature of the planned procedure, indications, risks, alternatives and potential complications including but not limited to bleeding, infection, perforation, damage to internal organs and possible oversedation/side effects from anesthesia. The patient agrees and gives consent to proceed.  Please refer to procedure notes for findings, recommendations and patient disposition/instructions.     Pamela Bill, MD [facility]

## 2023-06-11 NOTE — Transfer of Care (Signed)
 Immediate Anesthesia Transfer of Care Note  Patient: Pamela Nash  Procedure(s) Performed: COLONOSCOPY WITH PROPOFOL  Patient Location: PACU  Anesthesia Type:General  Level of Consciousness: drowsy  Airway & Oxygen Therapy: Patient Spontanous Breathing  Post-op Assessment: Report given to RN and Post -op Vital signs reviewed and stable  Post vital signs: Reviewed and stable  Last Vitals:  Vitals Value Taken Time  BP    Temp    Pulse 75 06/11/23 0941  Resp 13 06/11/23 0941  SpO2 100 % 06/11/23 0941  Vitals shown include unfiled device data.  Last Pain:  Vitals:   06/11/23 0902  TempSrc: Temporal  PainSc: 0-No pain         Complications: There were no known notable events for this encounter.

## 2023-09-06 ENCOUNTER — Other Ambulatory Visit: Payer: Self-pay | Admitting: Family Medicine

## 2023-09-06 DIAGNOSIS — Z1231 Encounter for screening mammogram for malignant neoplasm of breast: Secondary | ICD-10-CM

## 2023-09-19 ENCOUNTER — Other Ambulatory Visit: Payer: Self-pay | Admitting: Physician Assistant

## 2023-09-19 ENCOUNTER — Encounter: Payer: Self-pay | Admitting: Physician Assistant

## 2023-09-19 DIAGNOSIS — R202 Paresthesia of skin: Secondary | ICD-10-CM

## 2023-09-19 DIAGNOSIS — M5412 Radiculopathy, cervical region: Secondary | ICD-10-CM

## 2023-09-19 DIAGNOSIS — M542 Cervicalgia: Secondary | ICD-10-CM

## 2023-09-21 ENCOUNTER — Inpatient Hospital Stay
Admission: RE | Admit: 2023-09-21 | Discharge: 2023-09-21 | Disposition: A | Discharge status: Home / Self Care | Source: Ambulatory Visit | Attending: Physician Assistant | Admitting: Physician Assistant

## 2023-09-21 DIAGNOSIS — M542 Cervicalgia: Secondary | ICD-10-CM

## 2023-09-21 DIAGNOSIS — M5412 Radiculopathy, cervical region: Secondary | ICD-10-CM

## 2023-09-21 DIAGNOSIS — R200 Anesthesia of skin: Secondary | ICD-10-CM

## 2023-10-09 ENCOUNTER — Ambulatory Visit
Admission: RE | Admit: 2023-10-09 | Discharge: 2023-10-09 | Disposition: A | Discharge status: Home / Self Care | Source: Ambulatory Visit | Attending: Family Medicine | Admitting: Family Medicine

## 2023-10-09 DIAGNOSIS — Z1231 Encounter for screening mammogram for malignant neoplasm of breast: Secondary | ICD-10-CM | POA: Insufficient documentation

## 2024-02-10 NOTE — H&P (View-Only) (Signed)
 ORTHOPAEDIC SURGERY- CLINIC NOTE  Chief Complaint: Right hand numbness and tingling  History of Present Illness: History of Present Illness Pamela Nash is a 73 year old female with carpal tunnel syndrome who presents with hand numbness and tingling.  She has been experiencing numbness and tingling in her hands, primarily affecting the right side, since March. The symptoms mainly involve the thumb, although the entire hand has gone numb on occasion, particularly during activities like crafts. The numbness and tingling are intermittent and can wake her up at night.  She has tried wearing both long and short wrist splints, but they have not provided significant relief. She has also tried Tylenol  and Advil , which help, but she could not tolerate gabapentin or Lyrica.  During a nerve study, a needle was inserted into her thumb, which has since developed a trigger thumb, causing pain and popping when bent. This issue began immediately after the nerve study.  She mentions having two bone spurs on her spine but denies any pain or numbness radiating from neck movements. She also notes that certain fingers feel numb or tingly, with some variation in sensation between them.  Her past medical history includes diabetes and high blood pressure. She reports that her diabetes is currently under control with medication. She notes that steroid medications typically cause her blood sugar to drop, which is unusual.  Prior medical records from neurology were reviewed.    PMHx, PSurgHx, Fam Hx, Soc Hx, Meds, Allergies: Past Medical History:  Diagnosis Date  . Arthritis   . Carpal tunnel syndrome of right wrist 11/20/2023  . Cataracts, bilateral   . COVID-19 03/2020  . GERD (gastroesophageal reflux disease)   . History of blood transfusion   . History of colon polyps   . History of stroke 03/18/2018   TIA  . Hyperlipidemia   . Hypertension   . Internal hemorrhoids   . Osteoporosis   . Sleep  apnea   . TIA (transient ischemic attack)   . Type 2 diabetes mellitus (CMS/HHS-HCC)     Past Surgical History:  Procedure Laterality Date  . Hysterectomy with unilateral oophorectomy for fibroid tumors  1998  . HYSTERECTOMY  07/1996   TAH still has left ovary  . COLONOSCOPY  12/16/2017   Tubular adenoma/Hyperplastic colon polyp/Repeat 33yrs/MUS  (11/15/2022 Recall letter returned.awb)  . EGD  12/16/2017   GERD/Intestinal metaplasia/PHx of BE/Repeat 30yrs/MUS  . EGD  07/04/2021   GERD/No Repeat/CTL  . COLON @ [facility]  06/11/2023   PhxCP/normal examined colon/ repeat 10 yeras/ CTL  . CATARACT EXTRACTION Bilateral    Done in 10/2016 & 11/2016 per pt   . Colonoscopy x3, last 01/23/2013    . Right breast biopsies x3, benign    . TUBAL LIGATION    . Upper endoscopy 09/30/2000      Family History  Problem Relation Age of Onset  . Atrial fibrillation (Abnormal heart rhythm sometimes requiring treatment with blood thinners) Mother   . Stroke Mother   . Heart disease Mother   . Colon polyps Mother        Deceased  . High blood pressure (Hypertension) Father        Deceased  . Cancer Father   . Heart disease Father   . Lung cancer Father   . Bone cancer Father   . High blood pressure (Hypertension) Brother   . Colon polyps Sister   . Breast cancer Maternal Grandmother     Social History   Socioeconomic  History  . Marital status: Married  Tobacco Use  . Smoking status: Never    Passive exposure: Past  . Smokeless tobacco: Never  Vaping Use  . Vaping status: Never Used  Substance and Sexual Activity  . Alcohol use: No    Alcohol/week: 0.0 standard drinks of alcohol  . Drug use: No  . Sexual activity: Yes    Partners: Male    Birth control/protection: None   Social Drivers of Health   Financial Resource Strain: Low Risk  (02/11/2024)   Overall Financial Resource Strain (CARDIA)   . Difficulty of Paying Living Expenses: Not hard at all  Food Insecurity: No Food Insecurity  (02/11/2024)   Hunger Vital Sign   . Worried About Programme Researcher, Broadcasting/film/video in the Last Year: Never true   . Ran Out of Food in the Last Year: Never true  Transportation Needs: No Transportation Needs (02/11/2024)   PRAPARE - Transportation   . Lack of Transportation (Medical): No   . Lack of Transportation (Non-Medical): No  Housing Stability: Low Risk  (02/11/2024)   Housing Stability Vital Sign   . Unable to Pay for Housing in the Last Year: No   . Number of Times Moved in the Last Year: 0   . Homeless in the Last Year: No     Current Outpatient Medications  Medication Sig Dispense Refill  . alcohol swabs (DROPSAFE ALCOHOL PREP PADS) PadM USE AS DIRECTED 200 each 3  . ascorbic acid/multivit-min (EMERGEN-C ORAL) Take 750 mg by mouth once daily    . aspirin  81 MG EC tablet Take 81 mg by mouth once daily    . blood glucose control, low (TRUE METRIX LEVEL 1) Soln Use 1 each as directed 1 each 1  . cholecalciferol  (VITAMIN D3) 2,000 unit capsule Take 1 capsule (2,000 Units total) by mouth once daily    . cyanocobalamin  (VITAMIN B12) 1000 MCG tablet Take 1 tablet (1,000 mcg total) by mouth once daily    . ezetimibe (ZETIA) 10 mg tablet TAKE 1 TABLET BY MOUTH EVERY DAY 90 tablet 2  . glimepiride  (AMARYL ) 4 MG tablet TAKE 1 TABLET BY MOUTH TWICE A DAY 180 tablet 2  . hydroCHLOROthiazide  (HYDRODIURIL ) 25 MG tablet TAKE 1 TABLET BY MOUTH EVERY DAY 90 tablet 1  . losartan  (COZAAR ) 100 MG tablet TAKE 1 TABLET BY MOUTH EVERY DAY 100 tablet 1  . magnesium  oxide 400 mg magnesium  1 p.o. daily    . metoprolol  TARTrate (LOPRESSOR ) 25 MG tablet Take 1 tablet (25 mg total) by mouth 2 (two) times daily 180 tablet 3  . pantoprazole  (PROTONIX ) 20 MG DR tablet TAKE 1 TABLET BY MOUTH EVERY DAY 90 tablet 3  . pravastatin (PRAVACHOL) 80 MG tablet TAKE 1 TABLET BY MOUTH EVERYDAY AT BEDTIME 90 tablet 2  . semaglutide (RYBELSUS) 7 mg tablet TAKE 1 TABLET (7 MG TOTAL) BY MOUTH ONCE DAILY DO NOT CUT, CRUSH, OR CHEW 90  tablet 1  . TRUE METRIX GLUCOSE TEST STRIP test strip TEST BLOOD SUGAR TWICE DAILY 200 strip 3  . TRUEPLUS LANCETS TEST BLOOD SUGAR TWICE DAILY FOR DIABETES 200 each 3  . blood glucose meter kit by XX route as directed CHECK  BLOOD SUGARS TWO TIMES A DAY 1 each 0  . lancing device with lancets kit Use 1 each 2 (two) times daily Use as instructed. 1 each 0  . meloxicam  (MOBIC ) 15 MG tablet Take 1 tablet (15 mg total) by mouth daily with breakfast (Patient  not taking: Reported on 10/22/2023) 21 tablet 0  . pregabalin (LYRICA) 25 MG capsule Take 25mg  twice daily for one week, then increase to 50mg  twice daily and continue. (Patient not taking: Reported on 12/26/2023) 120 capsule 2   No current facility-administered medications for this visit.    Allergies  Allergen Reactions  . Cefuroxime Axetil Swelling    Felt like throat was swelling; headaches Felt like throat was swelling; headaches    . Crestor  [Rosuvastatin ] Muscle Pain  . Lisinopril Other (See Comments)    cough    . Lisinopril-Hydrochlorothiazide  Cough  . Metformin Diarrhea  . Sulfa (Sulfonamide Antibiotics) Hives and Rash  . Pioglitazone Other (See Comments), Nausea And Vomiting and Rash    Med caused weight gain Med caused weight gain    . Sulfasalazine Hives and Rash    Review of Systems: A 10+ ROS was performed, reviewed, and the pertinent orthopaedic findings are documented in the HPI.  I have reviewed and agree with the ROS captured by the CMA.    Physical Exam: BP 122/78   Ht 160 cm (5' 3)   Wt 67.1 kg (148 lb)   LMP  (LMP Unknown)   BMI 26.22 kg/m  General/Constitutional: No apparent distress: well-nourished and well developed. Eyes: Pupils equal, round with synchronous movement. Lymphatic: No palpable adenopathy. Respiratory: Patient has good chest rise and fall with inspiration and expiration.  All lung fields are clear to auscultation bilaterally.  There is no Rales, rhonchi or wheezes  appreciated. Cardiovascular: Upon auscultation there is a regular rate and rhythm without any murmurs, rubs, gallops or heaves appreciated. Integumentary: No impressive skin lesions present, except as noted in detailed exam. Neuro/Psych: Normal mood and affect, oriented to person, place and time. Musculoskeletal: see exam below  Right Upper Extremity: Tinel's, positive Durkan's, positive Phalen's at the wrist.  No thenar interest or interossei atrophy noted.  Equivocal Tinel's at the elbow, negative flexion compression test at the elbow.  5 out of 5 thumb palmar abduction strength.  5 out of 5 finger abduction strength.  Sensation to light touch decreased to all the fingers but most pronounced to the thumb after provocative testing.  Able to make a full composite fist.  Pain to the thumb A1 pulley.  Triggering with flexion extension of the thumb.  All the digits are warm and well-perfused. Imaging and Results: Electrodiagnostic studies obtained on 11/04/2023 were personally reviewed: EMG & NCV Findings: Evaluation of the Right median motor nerve showed prolonged distal onset latency (4.9 ms).  The Left median sensory nerve showed decreased conduction velocity (Wrist-2nd Digit, 36 m/s).  The Right median sensory nerve showed prolonged distal peak latency (4.6 ms) and decreased conduction velocity (Wrist-2nd Digit, 28 m/s).  The Left median/ulnar (palm) comparison nerve showed abnormal peak latency difference (Median Palm-Ulnar Palm, 0.8 ms).  The Right median/ulnar (palm) comparison nerve showed prolonged distal peak latency (Median Palm, 2.8 ms) and abnormal peak latency difference (Median Palm-Ulnar Palm, 1.4 ms).  All remaining nerves (as indicated in the following tables) were within normal limits.   EMG    Side Muscle Nerve Root Ins Act Fibs Psw Amp Dur Poly Recrt Int Bruna Comment  Right Abd Poll Brev Median C8-T1 Nml Nml Nml Incr >24ms 1+ Nml Nml    Right 1stDorInt Ulnar C8-T1 Nml Nml Nml Nml Nml  0 Nml Nml    Right PronatorTeres Median C6-7 Nml Nml Nml Nml Nml 0 Nml Nml    Right Biceps Musculocut C5-6 Nml  Nml Nml Nml Nml 0 Nml Nml    Right Triceps Radial C6-7-8 Nml Nml Nml Nml Nml 0 Nml Nml      Impression: Abnormal study.  There is electrodiagnostic evidence of a chronic, moderate right carpal tunnel syndrome.   Assessment & Plan: Assessment & Plan Right carpal tunnel syndrome. Discussed the pros and cons of pursuing surgical intervention as well as the risk, benefits, alternatives including but not limited to infection, incomplete relief of symptoms, nerve injury, injury to surrounding structures, pillar pain, chronic pain, stiffness.  He would like to undergo right carpal tunnel release - Scheduled carpal tunnel release surgery.  Per patient request we will schedule with local/MAC - Follow-up postoperatively  Right trigger thumb Pain and popping upon bending. Steroid injection recommended as potential cure, with surgery as alternative if injection fails. - Administered steroid injection to right trigger thumb. - Monitor response to steroid injection before considering surgery. - Discussed risks of steroid injection: infection, blood sugar fluctuations, allergic reaction  Procedure Note - Trigger Finger Injection - right thumb After discussing the various treatment options for the condition, It was agreed that a trigger finger injection would be the next step in treatment. The nature of and the indications for a corticosteroid and / or local anaesthetic injection were reviewed in detail with the patient today. The inherent risks of injection including infection, allergic reaction, increased pain, incomplete relief or temporary relief of symptoms, alterations of blood glucose levels requiring careful monitoring and treatment as indicated, tendon, ligament or articular cartilage rupture or degeneration, nerve injury, skin depigmentation, and/or fat atrophy were discussed.  After the  risks and benefits of the procedure were explained, consent was given, and time-out was performed. The volar aspect of the right thumb at the level of the A1 pulley was prepped with alcohol solution. The site was anesthetized with ethyl chloride. The A1 pulley was injected with 1 mL of Betamethasone 6 mg/ mL and 1 mL of 1% Lidocaine . The procedure was well tolerated.    Jackquline CANDIE Barrack, MD [facility] [address] [place], [place] [address] Phone: [phone]  This note was generated in part with voice recognition software; please excuse any typographical errors that were not detected and corrected. This note has been created using automated tools and reviewed for accuracy by [facility].

## 2024-02-13 ENCOUNTER — Other Ambulatory Visit: Payer: Self-pay

## 2024-02-19 NOTE — Anesthesia Preprocedure Evaluation (Addendum)
 Anesthesia Evaluation  Patient identified by MRN, date of birth, ID band Patient awake    Reviewed: Allergy & Precautions, H&P , NPO status , Patient's Chart, lab work & pertinent test results  History of Anesthesia Complications (+) PONV, Family history of anesthesia reaction and history of anesthetic complications  Airway Mallampati: III  TM Distance: <3 FB Neck ROM: Full    Dental no notable dental hx.    Pulmonary neg pulmonary ROS, sleep apnea    Pulmonary exam normal breath sounds clear to auscultation       Cardiovascular hypertension, negative cardio ROS Normal cardiovascular exam+ dysrhythmias  Rhythm:Regular Rate:Normal  03-19-18 echo eft ventricle: The cavity size was normal. There was mild    concentric hypertrophy. Systolic function was normal. The    estimated ejection fraction was in the range of 55% to 60%. Left    ventricular diastolic function parameters were normal.  - Pulmonary arteries: Systolic pressure was within the normal    range. Mild pulmonary valve regurgitation  Impressions:   - No cardiac source of emboli was indentified.      Neuro/Psych 03/18/2018  CT ANGIOGRAM AND VENOGRAM HEAD W OR WO CONTRAST  IMPRESSION: 1. Generalized age-related cerebral atrophy with mild chronic small vessel ischemic disease. No acute intracranial abnormality. 2. Negative CTA for large vessel occlusion or other acute vascular abnormality. No intracranial aneurysm. 3. Atheromatous plaque within the carotid siphons bilaterally with resultant mild to moderate multifocal narrowing as above, right greater than left. No other hemodynamically significant or correctable stenosis. 4. Negative CT venogram. No evidence for dural sinus thrombosis.  03/19/2018  MR BRAIN WO CONTRAST  IMPRESSION: Normal noncontrast MRI head for age.  03/19/2018  US  CAROTID BILATERAL  IMPRESSION: Mild plaque at the level of both carotid  bulbs. No evidence of internal carotid artery plaque or stenosis bilaterally.  03/19/2018  ECHOCARDIOGRAM COMPLETE IMPRESSION: - No cardiac source of emboli was indentified.   TIA Neuromuscular disease CVA negative neurological ROS  negative psych ROS   GI/Hepatic negative GI ROS, Neg liver ROS,GERD  ,,  Endo/Other  negative endocrine ROSdiabetes    Renal/GU negative Renal ROS  negative genitourinary   Musculoskeletal negative musculoskeletal ROS (+) Arthritis ,    Abdominal   Peds negative pediatric ROS (+)  Hematology negative hematology ROS (+)   Anesthesia Other Findings Medical History  Unspecified essential hypertension Diffuse cystic mastopathy Arthritis Breast screening, unspecified Special screening for malignant neoplasms, colon Obesity, unspecified High cholesterol Personal history of colonic polyps Atrial flutter (HCC) Family history of malignant neoplasm of breast Diabetes (HCC) Dysrhythmia GERD (gastroesophageal reflux disease) Complication of anesthesia PONV (postoperative nausea and vomiting)  Edema OSA on CPAP (continuous positive airway pressure) dependence  Carotid artery stenosis TIA (transient ischemic attack)  B12 deficiency Palpitations  Cataract of both eyes Osteoporosis  Family history of adverse reaction to anesthesia, mother has nausea Stroke (HCC)  Neuromuscular disorder (HCC) Mild pulmonary valve regurgitation     Reproductive/Obstetrics negative OB ROS                              Anesthesia Physical Anesthesia Plan  ASA: 3  Anesthesia Plan:    Post-op Pain Management:    Induction:   PONV Risk Score and Plan:   Airway Management Planned:   Additional Equipment:   Intra-op Plan:   Post-operative Plan:   Informed Consent:   Plan Discussed with:   Anesthesia Plan Comments:  Anesthesia Quick Evaluation

## 2024-02-25 ENCOUNTER — Other Ambulatory Visit: Payer: Self-pay

## 2024-02-25 ENCOUNTER — Ambulatory Visit: Admission: RE | Admit: 2024-02-25 | Discharge: 2024-02-25 | Disposition: A | Discharge status: Home / Self Care

## 2024-02-25 ENCOUNTER — Ambulatory Visit: Payer: Self-pay | Admitting: Anesthesiology

## 2024-02-25 ENCOUNTER — Encounter: Admission: RE | Disposition: A | Discharge status: Home / Self Care | Payer: Self-pay | Source: Home / Self Care

## 2024-02-25 HISTORY — DX: Nonrheumatic pulmonary valve insufficiency: I37.1

## 2024-02-25 HISTORY — DX: Cerebral infarction, unspecified: I63.9

## 2024-02-25 HISTORY — DX: Family history of other specified conditions: Z84.89

## 2024-02-25 HISTORY — DX: Myoneural disorder, unspecified: G70.9

## 2024-02-25 HISTORY — PX: CARPAL TUNNEL RELEASE: SHX101

## 2024-02-25 LAB — GLUCOSE, CAPILLARY
Glucose-Capillary: 152 mg/dL — ABNORMAL HIGH (ref 70–99)
Glucose-Capillary: 80 mg/dL (ref 70–99)
Glucose-Capillary: 110 mg/dL — ABNORMAL HIGH (ref 70–99)

## 2024-02-25 SURGERY — CARPAL TUNNEL RELEASE
Anesthesia: LOCAL | Site: Wrist | Laterality: Right

## 2024-02-25 MED ORDER — DEXTROSE 50 % IV SOLN
INTRAVENOUS | Status: AC
  Filled 2024-02-25: qty 50

## 2024-02-25 MED ORDER — FENTANYL CITRATE (PF) 100 MCG/2ML IJ SOLN
INTRAMUSCULAR | Status: DC | PRN
  Administered 2024-02-25 (×4): 25 ug via INTRAVENOUS

## 2024-02-25 MED ORDER — FENTANYL CITRATE (PF) 100 MCG/2ML IJ SOLN
INTRAMUSCULAR | Status: AC
  Filled 2024-02-25: qty 2

## 2024-02-25 MED ORDER — ONDANSETRON HCL 4 MG/2ML IJ SOLN
INTRAMUSCULAR | Status: AC
  Filled 2024-02-25: qty 2

## 2024-02-25 MED ORDER — DEXTROSE 50 % IV SOLN
12.0000 mL | Freq: Once | INTRAVENOUS | Status: AC
  Administered 2024-02-25: 12 mL via INTRAVENOUS

## 2024-02-25 MED ORDER — LIDOCAINE-EPINEPHRINE 1 %-1:100000 IJ SOLN
INTRAMUSCULAR | Status: AC
  Filled 2024-02-25: qty 1

## 2024-02-25 MED ORDER — ONDANSETRON HCL 4 MG/2ML IJ SOLN
INTRAMUSCULAR | Status: DC | PRN
  Administered 2024-02-25: 4 mg via INTRAVENOUS

## 2024-02-25 MED ORDER — SODIUM CHLORIDE 0.9 % IV SOLN: INTRAVENOUS | Status: DC

## 2024-02-25 MED ORDER — MIDAZOLAM HCL 2 MG/2ML IJ SOLN
INTRAMUSCULAR | Status: AC
  Filled 2024-02-25: qty 2

## 2024-02-25 MED ORDER — PROPOFOL 500 MG/50ML IV EMUL
INTRAVENOUS | Status: DC | PRN
  Administered 2024-02-25: 20 mg via INTRAVENOUS
  Administered 2024-02-25: 40 mg via INTRAVENOUS
  Administered 2024-02-25: 20 mg via INTRAVENOUS

## 2024-02-25 MED ORDER — LIDOCAINE HCL (CARDIAC) PF 100 MG/5ML IV SOSY
PREFILLED_SYRINGE | INTRAVENOUS | Status: DC | PRN
  Administered 2024-02-25: 60 mg via INTRAVENOUS

## 2024-02-25 MED ORDER — LACTATED RINGERS IV SOLN: INTRAVENOUS | Status: DC

## 2024-02-25 MED ORDER — 0.9 % SODIUM CHLORIDE (POUR BTL) OPTIME
TOPICAL | Status: DC | PRN
  Administered 2024-02-25: 500 mL

## 2024-02-25 SURGICAL SUPPLY — 16 items
BLADE MINI RND TIP GREEN BEAV (BLADE) ×1 IMPLANT
BNDG ELASTIC 2X5.8 VLCR NS LF (GAUZE/BANDAGES/DRESSINGS) ×1 IMPLANT
CHLORAPREP W/TINT 26 (MISCELLANEOUS) ×1 IMPLANT
CORD BIP STRL DISP 12FT (MISCELLANEOUS) ×1 IMPLANT
COVER LIGHT HANDLE UNIVERSAL (MISCELLANEOUS) ×2 IMPLANT
GAUZE SPONGE 4X4 12PLY STRL (GAUZE/BANDAGES/DRESSINGS) ×1 IMPLANT
GAUZE XEROFORM 1X8 LF (GAUZE/BANDAGES/DRESSINGS) ×1 IMPLANT
GLOVE BIOGEL PI IND STRL 8 (GLOVE) ×1 IMPLANT
GLOVE SURG SYN 7.5 PF PI (GLOVE) ×1 IMPLANT
GOWN STRL REIN LRG LVL4 (GOWNS) ×1 IMPLANT
GOWN STRL REUS W/ TWL LRG LVL3 (GOWN DISPOSABLE) ×1 IMPLANT
NS IRRIG 500ML POUR BTL (IV SOLUTION) ×1 IMPLANT
PACK EXTREMITY ARMC (MISCELLANEOUS) ×1 IMPLANT
PADDING CAST BLEND 2X4 STRL (MISCELLANEOUS) IMPLANT
STRAP BODY AND KNEE 60X3 (MISCELLANEOUS) ×1 IMPLANT
SUT ETHILON 4 0 PS 2 18 (SUTURE) ×1 IMPLANT

## 2024-02-25 NOTE — Transfer of Care (Signed)
 Immediate Anesthesia Transfer of Care Note  Patient: Pamela Nash  Procedure(s) Performed: CARPAL TUNNEL RELEASE (Right: Wrist)  Patient Location: PACU  Anesthesia Type: No value filed.  Level of Consciousness: awake, alert  and patient cooperative  Airway and Oxygen Therapy: Patient Spontanous Breathing and Patient connected to supplemental oxygen  Post-op Assessment: Post-op Vital signs reviewed, Patient's Cardiovascular Status Stable, Respiratory Function Stable, Patent Airway and No signs of Nausea or vomiting  Post-op Vital Signs: Reviewed and stable  Complications: No notable events documented.

## 2024-02-25 NOTE — Op Note (Signed)
 Date of procedure:  02/25/2024  Surgeon: Jackquline CANDIE Barrack, MD   Procedure(s) performed:   Right carpal tunnel release (CPT [phone])  Assistant(s): none  Preoperative diagnosis:   Right carpal tunnel syndrome   Postoperative diagnosis:   Right carpal tunnel syndrome   Procedure findings:  Tight carpal tunnel  Anesthesia:  Local + MAC   Estimated blood loss:  Minimal   Specimen:  None   Complications:  None apparent   Implants:  None   Indications: 73 y.o. year old female with right carpal tunnel syndrome that failed conservative management.  We discussed operative treatment and explained the risks, benefits, and alternatives as well as the expected postoperative course.  The patient elected to proceed with surgery.   Procedure in detail:  The patient was met in the pre-op holding area and the right upper extremity was marked and the consent confirmed.  A local field block with 10 mL 1% lidocaine  with epinephrine  was administered for surgical anesthesia.  The patient was then taken to the operating room and the hand table was attached to the stretcher.  Antibiotics were not indicated.  All bony prominences were well padded.  The operative extremity was then prepped and draped in the usual sterile fashion.  A timeout was performed confirming the correct patient, site, and procedure.   A longitudinal incision was made in line with the radial aspect of the ring finger and extending from just distal to the wrist flexion crease to Kaplan's cardinal line.  Sharp dissection was carried down through the palmar fascia until the transverse carpal ligament was encountered.  Overlying thenar muscle was swept radially off of the transverse carpal ligament.  The transverse carpal ligament was then carefully incised and released in its entirety, distally to the fat pad surrounding the superficial palmar arch and proximally to the antebrachial fascia.  The distal aspect of the antebrachial fascia was  released with scissors under direct visualization.    The wound was irrigated with normal saline.  The incision was closed with 4-0 Nylon.  Sterile dressings were applied.  The patient was transferred to the PACU in stable condition.  Counts were correct x2.

## 2024-02-25 NOTE — Anesthesia Postprocedure Evaluation (Signed)
 Anesthesia Post Note  Patient: Pamela Nash  Procedure(s) Performed: CARPAL TUNNEL RELEASE (Right: Wrist)  Patient location during evaluation: PACU Anesthesia Type: MAC Level of consciousness: awake and alert Pain management: pain level controlled Vital Signs Assessment: post-procedure vital signs reviewed and stable Respiratory status: spontaneous breathing, nonlabored ventilation, respiratory function stable and patient connected to nasal cannula oxygen Cardiovascular status: stable and blood pressure returned to baseline Postop Assessment: no apparent nausea or vomiting Anesthetic complications: no   No notable events documented.   Last Vitals:  Vitals:   02/25/24 1303 02/25/24 1315  BP: (!) 98/56 (!) 106/51  Pulse:  72  Resp: (!) 25 16  Temp: (!) 36.3 C (!) 36.3 C  SpO2: 100% 99%    Last Pain:  Vitals:   02/25/24 1315  TempSrc:   PainSc: 0-No pain                 Kayona Foor C Keyonni Percival

## 2024-02-25 NOTE — Interval H&P Note (Signed)
 History and Physical Interval Note:  02/25/2024 11:48 AM  Pamela Nash  has presented today for surgery, with the diagnosis of Carpal tunnel syndrome, right G56.01.  The various methods of treatment have been discussed with the patient and family. After consideration of risks, benefits and other options for treatment, the patient has consented to  Procedure(s) with comments: CARPAL TUNNEL RELEASE (Right) - Using Local/MAC as a surgical intervention.  The patient's history has been reviewed, patient examined, no change in status, stable for surgery.  I have reviewed the patient's chart and labs.  Questions were answered to the patient's satisfaction.     Jackquline GORMAN Barrack

## 2024-02-25 NOTE — Discharge Instructions (Signed)
 Discharge Instructions After Surgery   Diet   Return to your regular diet as tolerated.   Dressings   You may remove your dressings after 3-4 days.  Okay to shower at this point but do not soak in water (baths, swimming, etc).  It is okay to have a small spot of blood on your dressing as long as it does not keep getting bigger.   In the first 3-4 days, cover the dressing and keep it dry in the shower or bath. Keep your arm up and out of the water.   Do not put any ointments, creams, alcohol , or hydrogen peroxide on your incisions.  Activity   If your fingers are free, work on making a full fist and straightening out the fingers within the limits of your dressing.  Move other joints such as your elbow or shoulder multiple times a day to prevent getting stiff.  No lifting, carrying, pushing, or pulling with your arm until your follow-up visit. You may use your hand or fingers for simple things like writing, typing, eating, and brushing your teeth.   If you were given a sling, wear it while your hand or arm is numb. You can stop wearing the sling when you have feeling and strength back in your hand or arm. You may also use the sling as needed to elevate, protect, or support your arm until your follow-up visit.   Pain   The first 48 hours after surgery can hurt the most. You should use the different types of pain medicine along with rest and elevation to help keep the pain manageable.   NSAIDS such as ibuprofen (Advil or Motrin) or naproxen  (Aleve ) can help with pain and swelling.   Acetaminophen  (Tylenol ) can be used with NSAIDs. Do not take more than 3000 mg of Tylenol  in a 24-hour period.   Narcotic pain medicine such as hydrocodone  or oxycodone , should be used as prescribed. Stop taking as soon as possible. Take with food to help prevent nausea. Do not drink alcohol  or drive while taking narcotics. Be aware that hydrocodone  tablets often already contain acetaminophen  (Tylenol ).   Elevating your operative extremity above the level of your heart will help with pain and swelling. You can rest your arm on several pillows for support while sleeping or resting.   Constipation   Narcotic pain medicines, changes in eating and drinking, and less activity may cause constipation after surgery.   Over-the-counter stool softeners like docusate (Colace), Senna, or Miralax can help. Follow the directions on the bottle.   Stay hydrated and try to eat high fiber foods.   When to call your surgeon   If your dressing gets wet or very dirty.   Fever more than 101 F.   Incision that is very red, swollen, draining pus, or feels hot.   Severe pain, not getting better with pain medicine.   Severe swelling, even with elevation.   If your fingers become cool, cold, or dark in color.   Bleeding that is getting bigger or soaking through the dressing.   Severe nausea and vomiting.   Problems using the bathroom or no bowel movement for 2-3 days.   For questions or concerns, please call [phone].  Jackquline CANDIE Barrack, MD Orthopaedic Surgery [facility]
